# Patient Record
Sex: Female | Born: 1970 | Race: White | Hispanic: No | Marital: Married | Smoking: Light tobacco smoker
Health system: Southern US, Community
[De-identification: ages and names within clinical notes are randomized; demographics above are authoritative.]

## PROBLEM LIST (undated history)

## (undated) DIAGNOSIS — E785 Hyperlipidemia, unspecified: Secondary | ICD-10-CM

## (undated) DIAGNOSIS — R011 Cardiac murmur, unspecified: Secondary | ICD-10-CM

## (undated) DIAGNOSIS — J45909 Unspecified asthma, uncomplicated: Secondary | ICD-10-CM

## (undated) DIAGNOSIS — N200 Calculus of kidney: Secondary | ICD-10-CM

## (undated) DIAGNOSIS — T7840XA Allergy, unspecified, initial encounter: Secondary | ICD-10-CM

## (undated) DIAGNOSIS — B019 Varicella without complication: Secondary | ICD-10-CM

## (undated) HISTORY — DX: Calculus of kidney: N20.0

## (undated) HISTORY — PX: OTHER SURGICAL HISTORY: SHX169

## (undated) HISTORY — DX: Unspecified asthma, uncomplicated: J45.909

## (undated) HISTORY — DX: Cardiac murmur, unspecified: R01.1

## (undated) HISTORY — DX: Varicella without complication: B01.9

## (undated) HISTORY — PX: OVARIAN CYST REMOVAL: SHX89

## (undated) HISTORY — DX: Hyperlipidemia, unspecified: E78.5

## (undated) HISTORY — DX: Allergy, unspecified, initial encounter: T78.40XA

---
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
## 2012-09-12 LAB — HM MAMMOGRAPHY: HM Mammogram: NEGATIVE

## 2014-09-10 LAB — HM PAP SMEAR: HM PAP: NORMAL

## 2015-03-11 ENCOUNTER — Encounter: Payer: Self-pay | Admitting: Nurse Practitioner

## 2015-03-11 ENCOUNTER — Ambulatory Visit (INDEPENDENT_AMBULATORY_CARE_PROVIDER_SITE_OTHER): Payer: 59 | Admitting: Nurse Practitioner

## 2015-03-11 VITALS — BP 118/78 | HR 81 | Temp 99.1°F | Resp 14 | Ht 63.75 in | Wt 162.6 lb

## 2015-03-11 DIAGNOSIS — Z789 Other specified health status: Secondary | ICD-10-CM | POA: Diagnosis not present

## 2015-03-11 DIAGNOSIS — Z7189 Other specified counseling: Secondary | ICD-10-CM | POA: Diagnosis not present

## 2015-03-11 DIAGNOSIS — Z7689 Persons encountering health services in other specified circumstances: Secondary | ICD-10-CM

## 2015-03-11 DIAGNOSIS — E785 Hyperlipidemia, unspecified: Secondary | ICD-10-CM | POA: Insufficient documentation

## 2015-03-11 DIAGNOSIS — E079 Disorder of thyroid, unspecified: Secondary | ICD-10-CM | POA: Diagnosis not present

## 2015-03-11 DIAGNOSIS — J45990 Exercise induced bronchospasm: Secondary | ICD-10-CM

## 2015-03-11 DIAGNOSIS — R011 Cardiac murmur, unspecified: Secondary | ICD-10-CM

## 2015-03-11 LAB — COMPREHENSIVE METABOLIC PANEL
ALT: 16 U/L (ref 0–35)
AST: 21 U/L (ref 0–37)
Albumin: 4 g/dL (ref 3.5–5.2)
Alkaline Phosphatase: 68 U/L (ref 39–117)
BUN: 15 mg/dL (ref 6–23)
CALCIUM: 9.4 mg/dL (ref 8.4–10.5)
CHLORIDE: 105 meq/L (ref 96–112)
CO2: 24 meq/L (ref 19–32)
CREATININE: 0.54 mg/dL (ref 0.40–1.20)
GFR: 131.01 mL/min (ref >60.00)
GLUCOSE: 77 mg/dL (ref 70–99)
Potassium: 4.4 mEq/L (ref 3.5–5.1)
Sodium: 139 mEq/L (ref 135–145)
Total Bilirubin: 0.3 mg/dL (ref 0.2–1.2)
Total Protein: 6.8 g/dL (ref 6.0–8.3)

## 2015-03-11 LAB — T4, FREE: FREE T4: 0.79 ng/dL (ref 0.60–1.60)

## 2015-03-11 LAB — TSH: TSH: 0.41 u[IU]/mL (ref 0.35–4.50)

## 2015-03-11 NOTE — Progress Notes (Signed)
Pre visit review using our clinic review tool, if applicable. No additional management support is needed unless otherwise documented below in the visit note. 

## 2015-03-11 NOTE — Assessment & Plan Note (Signed)
Stable on Lipitor. Pt has a healthy lifestyle. Family history of lipid disorders and heart disease.

## 2015-03-11 NOTE — Assessment & Plan Note (Signed)
Discussed acute and chronic issues. Reviewed health maintenance measures, PFSHx, and immunizations. Obtain records from [place].

## 2015-03-11 NOTE — Assessment & Plan Note (Signed)
Innocent murmur that is undetectable at today's visit. Checked out by Cardiology in [place] with no abnormal findings. Will follow as needed.

## 2015-03-11 NOTE — Assessment & Plan Note (Signed)
Pt reports she does not seroconvert to a positive titer despite up to date immunizations. Will obtain records from [place].

## 2015-03-11 NOTE — Progress Notes (Signed)
Patient ID: Emily Burns, female    DOB: 04/20/1971  Age: 44 y.o. MRN: [id number]  CC: Establish Care   HPI Emily Burns presents for establishing care and CC of thyroid disease concern.   1) New pt info:   Immunizations- Unknown, Flu- Declines today  Mammogram- 2 years ago, normal per pt  Pap- 4/16 back in [place], normal per pt   Eye Exam- 09/2014   Dental Exam- UTD  LMP- 02/2015, regular lasts for 3 days   2) Chronic Problems-  Overweight- Having a hard time losing weight    Diet- Eats at home often, cuts down on "white foods"    Exercise- walks 2 miles 3 x a week  Hyperlipidemia- Lipitor 40 mg on board  Allergies- hay fever, using OTC medications  Measles- Titers in past show non-conversion from vaccine to show immunity   Asthma- Albuterol inhaler prior to exercise   Heart Murmur- Innocent, has been checked by Cardiologist, 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   She has past surgical history that includes Tonsillectomy and adenoidectomy (1982); Cesarean section (2011/2012); Ovarian cyst removal; and kidney stone (2010/2011).   Her family history includes Heart disease in her father; Hyperlipidemia in her brother, maternal aunt, maternal grandfather, maternal uncle, and mother; Hypertension in her father, maternal grandmother, and mother; Thyroid disease in her paternal aunt and paternal grandmother.She reports that she quit smoking about 20 years ago. Her smoking use included Cigarettes. She has never used smokeless tobacco. She reports that she drinks alcohol. She reports that she does not  use illicit drugs.  No outpatient prescriptions prior to visit.   No facility-administered medications prior to visit.    ROS Review of Systems  Constitutional: Negative for fever, chills, diaphoresis, activity change, appetite change and fatigue.  HENT: Negative for tinnitus and trouble swallowing.   Eyes: Negative for visual disturbance.  Respiratory: Negative for chest tightness, shortness of breath and wheezing.   Cardiovascular: Negative for chest pain, palpitations and leg swelling.  Gastrointestinal: Negative for nausea, vomiting, diarrhea and constipation.  Endocrine: Negative for cold intolerance and heat intolerance.  Skin: Negative for rash.  Neurological: Negative for dizziness and headaches.  Psychiatric/Behavioral: Negative for suicidal ideas and sleep disturbance. The patient is nervous/anxious.     Objective:  BP 118/78 mmHg  Pulse 81  Temp(Src) 99.1 F (37.3 C)  Resp 14  Ht 5' 3.75" (1.619 m)  Wt 162 lb 9.6 oz (73.755 kg)  BMI 28.14 kg/m2  SpO2 97%  Physical Exam  Constitutional: She is oriented to person, place, and time. She appears well-developed and well-nourished. No distress.  HENT:  Head: Normocephalic and atraumatic.  Right Ear: External ear normal.  Left Ear: External ear normal.  Cardiovascular: Normal rate, regular rhythm and normal heart sounds.  Exam reveals no gallop and no friction rub.   No murmur heard. Pulmonary/Chest: Effort normal and breath sounds normal. No respiratory distress. She has no wheezes. She has no rales. She exhibits no tenderness.  Neurological: She is alert and oriented to person, place, and time. No cranial nerve deficit. She exhibits normal muscle tone. Coordination normal.  Skin:  Skin is warm and dry. No rash noted. She is not diaphoretic.  Psychiatric: She has a normal mood and affect. Her behavior is normal. Judgment and thought content normal.   Assessment & Plan:   Emily Burns was seen today for establish  care.  Diagnoses and all orders for this visit:  Thyroid disease -     Comprehensive metabolic panel -     TSH -     T4, free -     T3  Hyperlipidemia -     Comprehensive metabolic panel  Encounter to establish care   I am having Emily Burns maintain her atorvastatin, Biotin, B-complex with vitamin C, Calcium-Magnesium (CAL-MAG PO), and Multiple Vitamin (MULTI-VITAMIN DAILY PO).  Meds ordered this encounter  Medications  . atorvastatin (LIPITOR) 40 MG tablet    Sig: Take 40 mg by mouth daily.  . Biotin 5000 MCG TABS    Sig: Take by mouth daily.  . B Complex-C (B-COMPLEX WITH VITAMIN C) tablet    Sig: Take 1 tablet by mouth daily.  . Calcium-Magnesium (CAL-MAG PO)    Sig: Take by mouth daily.  . Multiple Vitamin (MULTI-VITAMIN DAILY PO)    Sig: Take by mouth daily.     Follow-up: Return in about 6 months (around 09/09/2015) for CPE with fasting labs .

## 2015-03-11 NOTE — Patient Instructions (Signed)
Welcome to [facility]! Nice to meet you.  Please visit the lab before leaving today.   See you back in 6 months (fasting labs and physical)

## 2015-03-11 NOTE — Assessment & Plan Note (Signed)
Asthma is EIB. Pt has albuterol inhaler that she uses when going to exercise. Will follow

## 2015-03-11 NOTE — Assessment & Plan Note (Signed)
Pt concerned about thyroid due to history of biopsy and failing medications in April. Will obtain records and will obtain thyroid panel today.

## 2015-03-12 LAB — T3: T3 TOTAL: 115.64 ng/dL (ref 80.0–204.0)

## 2015-04-11 ENCOUNTER — Ambulatory Visit (INDEPENDENT_AMBULATORY_CARE_PROVIDER_SITE_OTHER): Payer: 59 | Admitting: Nurse Practitioner

## 2015-04-11 ENCOUNTER — Encounter: Payer: Self-pay | Admitting: Nurse Practitioner

## 2015-04-11 VITALS — BP 128/89 | HR 109 | Temp 99.0°F | Ht 63.75 in | Wt 160.1 lb

## 2015-04-11 DIAGNOSIS — M62838 Other muscle spasm: Secondary | ICD-10-CM | POA: Diagnosis not present

## 2015-04-11 MED ORDER — CYCLOBENZAPRINE HCL 5 MG PO TABS: 5.0000 mg | ORAL_TABLET | Freq: Three times a day (TID) | ORAL | Status: AC | PRN

## 2015-04-11 MED ORDER — PREDNISONE 10 MG PO TABS: ORAL_TABLET | ORAL | Status: DC

## 2015-04-11 NOTE — Progress Notes (Signed)
Patient ID: Emily Burns, female    DOB: 02/07/1971  Age: 44 y.o. MRN: [id number]  CC: Shoulder Pain   HPI Emily Burns presents for CC of neck and rib pain.  1) Started Saturday, sharp pain on left lateral ribs, lasted a few seconds, deep breathing caused pain  Heating pad Motrin  Pain was gone in ribs yesterday   Picked 44 year old up and felt discomfort again afterwards Pressure of shoulder on left and a visible bulging from the shoulder arm Motrin- helpful   History Emily Burns has a past medical history of Asthma; Chicken pox; Allergy; Heart murmur; Hyperlipidemia; and Kidney stone.   Emily Burns has past surgical history that includes Tonsillectomy and adenoidectomy (1982); Cesarean section (2011/2012); Ovarian cyst removal; and kidney stone (2010/2011).   Her family history includes Heart disease in her father; Hyperlipidemia in her brother, maternal aunt, maternal grandfather, maternal uncle, and mother; Hypertension in her father, maternal grandmother, and mother; Thyroid disease in her paternal aunt and paternal grandmother.Emily Burns reports that Emily Burns quit smoking about 20 years ago. Her smoking use included Cigarettes. Emily Burns has never used smokeless tobacco. Emily Burns reports that Emily Burns drinks alcohol. Emily Burns reports that Emily Burns does not use illicit drugs.  Outpatient Prescriptions Prior to Visit  Medication Sig Dispense Refill  . atorvastatin (LIPITOR) 40 MG tablet Take 40 mg by mouth daily.    . B Complex-C (B-COMPLEX WITH VITAMIN C) tablet Take 1 tablet by mouth daily.    . Biotin 5000 MCG TABS Take by mouth daily.    . Calcium-Magnesium (CAL-MAG PO) Take by mouth daily.    . Multiple Vitamin (MULTI-VITAMIN DAILY PO) Take by mouth daily.     No facility-administered medications prior to visit.    ROS Review of Systems  Constitutional: Negative for fever, chills, diaphoresis and fatigue.  Respiratory: Negative for chest tightness, shortness of breath and wheezing.   Cardiovascular: Negative  for chest pain, palpitations and leg swelling.  Gastrointestinal: Negative for nausea, vomiting and diarrhea.  Musculoskeletal: Positive for myalgias and arthralgias.  Skin: Negative for rash.  Neurological: Negative for dizziness, weakness, numbness and headaches.  Psychiatric/Behavioral: The patient is nervous/anxious.     Objective:  BP 128/89 mmHg  Pulse 109  Temp(Src) 99 F (37.2 C)  Ht 5' 3.75" (1.619 m)  Wt 160 lb 1.9 oz (72.63 kg)  BMI 27.71 kg/m2  Physical Exam  Constitutional: Emily Burns is oriented to person, place, and time. Emily Burns appears well-developed and well-nourished. No distress.  HENT:  Head: Normocephalic and atraumatic.  Right Ear: External ear normal.  Left Ear: External ear normal.  Cardiovascular: Normal rate, regular rhythm, normal heart sounds and intact distal pulses.  Exam reveals no gallop and no friction rub.   No murmur heard. Pulmonary/Chest: Effort normal and breath sounds normal. No respiratory distress. Emily Burns has no wheezes. Emily Burns has no rales. Emily Burns exhibits no tenderness.  Musculoskeletal: Normal range of motion. Emily Burns exhibits edema and tenderness.       Arms: Noticeable swelling on left clavicular area vs. Right    Neurological: Emily Burns is alert and oriented to person, place, and time. No cranial nerve deficit. Emily Burns exhibits normal muscle tone. Coordination normal.  Skin: Skin is warm and dry. No rash noted. Emily Burns is not diaphoretic.  Psychiatric: Emily Burns has a normal mood and affect. Her behavior is normal. Judgment and thought content normal.    Assessment & Plan:   Emily Burns was seen today for shoulder pain.  Diagnoses and all orders for this visit:  Muscle  spasm  Other orders -     predniSONE (DELTASONE) 10 MG tablet; Take 6 tablets by mouth on day 1 then decrease by 1 tablet each day until gone. -     cyclobenzaprine (FLEXERIL) 5 MG tablet; Take 1 tablet (5 mg total) by mouth 3 (three) times daily as needed for muscle spasms.   I am having Emily Burns  start on predniSONE and cyclobenzaprine. I am also having her maintain her atorvastatin, Biotin, B-complex with vitamin C, Calcium-Magnesium (CAL-MAG PO), and Multiple Vitamin (MULTI-VITAMIN DAILY PO).  Meds ordered this encounter  Medications  . predniSONE (DELTASONE) 10 MG tablet    Sig: Take 6 tablets by mouth on day 1 then decrease by 1 tablet each day until gone.    Dispense:  21 tablet    Refill:  0    Order Specific Question:  Supervising Provider    Answer:  Duncan DullULLO, TERESA L [[id number]]  . cyclobenzaprine (FLEXERIL) 5 MG tablet    Sig: Take 1 tablet (5 mg total) by mouth 3 (three) times daily as needed for muscle spasms.    Dispense:  30 tablet    Refill:  0    Order Specific Question:  Supervising Provider    Answer:  Sherlene ShamsULLO, TERESA L [[id number]]     Follow-up: Return if symptoms worsen or fail to improve.

## 2015-04-11 NOTE — Patient Instructions (Signed)
Prednisone with breakfast or lunch at the latest.  6 tablets on day 1, 5 tablets on day 2, 4 tablets on day 3, 3 tablets on day 4, 2 tablets day 5, 1 tablet on day 6...done! Take tablets all together not spaced out Don't take with NSAIDs (Ibuprofen, Aleve, Naproxen, Meloxicam ect...)  Flexeril at night- do not drive while taking this.   Send me an update in 1 week-ish on MyChart to see how you are doing.  Muscle Cramps and Spasms Muscle cramps and spasms occur when a muscle or muscles tighten and you have no control over this tightening (involuntary muscle contraction). They are a common problem and can develop in any muscle. The most common place is in the calf muscles of the leg. Both muscle cramps and muscle spasms are involuntary muscle contractions, but they also have differences:   Muscle cramps are sporadic and painful. They may last a few seconds to a quarter of an hour. Muscle cramps are often more forceful and last longer than muscle spasms.  Muscle spasms may or may not be painful. They may also last just a few seconds or much longer. CAUSES  It is uncommon for cramps or spasms to be due to a serious underlying problem. In many cases, the cause of cramps or spasms is unknown. Some common causes are:   Overexertion.   Overuse from repetitive motions (doing the same thing over and over).   Remaining in a certain position for a long period of time.   Improper preparation, form, or technique while performing a sport or activity.   Dehydration.   Injury.   Side effects of some medicines.   Abnormally low levels of the salts and ions in your blood (electrolytes), especially potassium and calcium. This could happen if you are taking water pills (diuretics) or you are pregnant.  Some underlying medical problems can make it more likely to develop cramps or spasms. These include, but are not limited to:   Diabetes.   Parkinson disease.   Hormone disorders, such as  thyroid problems.   Alcohol abuse.   Diseases specific to muscles, joints, and bones.   Blood vessel disease where not enough blood is getting to the muscles.  HOME CARE INSTRUCTIONS   Stay well hydrated. Drink enough water and fluids to keep your urine clear or pale yellow.  It may be helpful to massage, stretch, and relax the affected muscle.  For tight or tense muscles, use a warm towel, heating pad, or hot shower water directed to the affected area.  If you are sore or have pain after a cramp or spasm, applying ice to the affected area may relieve discomfort.  Put ice in a plastic bag.  Place a towel between your skin and the bag.  Leave the ice on for 15-20 minutes, 03-04 times a day.  Medicines used to treat a known cause of cramps or spasms may help reduce their frequency or severity. Only take over-the-counter or prescription medicines as directed by your caregiver. SEEK MEDICAL CARE IF:  Your cramps or spasms get more severe, more frequent, or do not improve over time.  MAKE SURE YOU:   Understand these instructions.  Will watch your condition.  Will get help right away if you are not doing well or get worse.   This information is not intended to replace advice given to you by your health care provider. Make sure you discuss any questions you have with your health care provider.  Document Released: 11/14/2001 Document Revised: 09/19/2012 Document Reviewed: 05/11/2012 Elsevier Interactive Patient Education [employer]

## 2015-04-15 ENCOUNTER — Encounter: Payer: Self-pay | Admitting: Nurse Practitioner

## 2015-04-15 ENCOUNTER — Other Ambulatory Visit: Payer: Self-pay | Admitting: Nurse Practitioner

## 2015-04-15 ENCOUNTER — Ambulatory Visit (INDEPENDENT_AMBULATORY_CARE_PROVIDER_SITE_OTHER): Payer: 59 | Admitting: Nurse Practitioner

## 2015-04-15 ENCOUNTER — Ambulatory Visit
Admission: RE | Admit: 2015-04-15 | Discharge: 2015-04-15 | Disposition: A | Discharge status: Home / Self Care | Payer: 59 | Source: Ambulatory Visit | Attending: Nurse Practitioner | Admitting: Nurse Practitioner

## 2015-04-15 ENCOUNTER — Telehealth: Payer: Self-pay | Admitting: Nurse Practitioner

## 2015-04-15 VITALS — BP 130/82 | HR 100 | Temp 98.5°F | Wt 161.0 lb

## 2015-04-15 DIAGNOSIS — M609 Myositis, unspecified: Secondary | ICD-10-CM

## 2015-04-15 DIAGNOSIS — IMO0001 Reserved for inherently not codable concepts without codable children: Secondary | ICD-10-CM | POA: Insufficient documentation

## 2015-04-15 DIAGNOSIS — M62838 Other muscle spasm: Secondary | ICD-10-CM | POA: Insufficient documentation

## 2015-04-15 DIAGNOSIS — M791 Myalgia: Secondary | ICD-10-CM | POA: Diagnosis not present

## 2015-04-15 DIAGNOSIS — R221 Localized swelling, mass and lump, neck: Secondary | ICD-10-CM | POA: Diagnosis not present

## 2015-04-15 DIAGNOSIS — S46812D Strain of other muscles, fascia and tendons at shoulder and upper arm level, left arm, subsequent encounter: Secondary | ICD-10-CM

## 2015-04-15 LAB — C-REACTIVE PROTEIN: CRP: 0.1 mg/dL — ABNORMAL LOW (ref 0.5–20.0)

## 2015-04-15 LAB — CBC WITH DIFFERENTIAL/PLATELET
BASOS ABS: 0.1 10*3/uL (ref 0.0–0.1)
Basophils Relative: 0.7 % (ref 0.0–3.0)
EOS PCT: 0.7 % (ref 0.0–5.0)
Eosinophils Absolute: 0.1 10*3/uL (ref 0.0–0.7)
HEMATOCRIT: 38.8 % (ref 36.0–46.0)
Hemoglobin: 13 g/dL (ref 12.0–15.0)
LYMPHS PCT: 39.2 % (ref 12.0–46.0)
Lymphs Abs: 4.3 10*3/uL — ABNORMAL HIGH (ref 0.7–4.0)
MCHC: 33.4 g/dL (ref 30.0–36.0)
MCV: 86.8 fl (ref 78.0–100.0)
MONOS PCT: 8.1 % (ref 3.0–12.0)
Monocytes Absolute: 0.9 10*3/uL (ref 0.1–1.0)
NEUTROS ABS: 5.6 10*3/uL (ref 1.4–7.7)
Neutrophils Relative %: 51.3 % (ref 43.0–77.0)
PLATELETS: 357 10*3/uL (ref 150.0–400.0)
RBC: 4.47 Mil/uL (ref 3.87–5.11)
RDW: 13.8 % (ref 11.5–15.5)
WBC: 10.9 10*3/uL — ABNORMAL HIGH (ref 4.0–10.5)

## 2015-04-15 LAB — SEDIMENTATION RATE: Sed Rate: 14 mm/hr (ref 0–22)

## 2015-04-15 LAB — TROPONIN I: TNIDX: 0.01 ug/L (ref 0.00–0.06)

## 2015-04-15 MED ORDER — MELOXICAM 15 MG PO TABS: 15.0000 mg | ORAL_TABLET | Freq: Every day | ORAL | Status: DC

## 2015-04-15 NOTE — Assessment & Plan Note (Signed)
Will obtain US of head and neck. Will obtain Troponin, ESR, CRP, and CBC w/ diff.

## 2015-04-15 NOTE — Progress Notes (Signed)
Patient ID: Emily Burns, female    DOB: 09/04/1970  Age: 44 y.o. MRN: [id number]  CC: Follow-up   HPI Malon Hollar presents for follow up of lump at neck.   1) Lump is larger at neck and causing pain. Prednisone made her feel loopy. She reports constant headaches and pressure from swelling. She is very anxious about this.   History Skyeler has a past medical history of Asthma; Chicken pox; Allergy; Heart murmur; Hyperlipidemia; and Kidney stone.   She has past surgical history that includes Tonsillectomy and adenoidectomy (1982); Cesarean section (2011/2012); Ovarian cyst removal; and kidney stone (2010/2011).   Her family history includes Heart disease in her father; Hyperlipidemia in her brother, maternal aunt, maternal grandfather, maternal uncle, and mother; Hypertension in her father, maternal grandmother, and mother; Thyroid disease in her paternal aunt and paternal grandmother.She reports that she quit smoking about 20 years ago. Her smoking use included Cigarettes. She has never used smokeless tobacco. She reports that she drinks alcohol. She reports that she does not use illicit drugs.  Outpatient Prescriptions Prior to Visit  Medication Sig Dispense Refill  . atorvastatin (LIPITOR) 40 MG tablet Take 40 mg by mouth daily.    . B Complex-C (B-COMPLEX WITH VITAMIN C) tablet Take 1 tablet by mouth daily.    . Biotin 5000 MCG TABS Take by mouth daily.    . Calcium-Magnesium (CAL-MAG PO) Take by mouth daily.    . cyclobenzaprine (FLEXERIL) 5 MG tablet Take 1 tablet (5 mg total) by mouth 3 (three) times daily as needed for muscle spasms. 30 tablet 0  . Multiple Vitamin (MULTI-VITAMIN DAILY PO) Take by mouth daily.    . predniSONE (DELTASONE) 10 MG tablet Take 6 tablets by mouth on day 1 then decrease by 1 tablet each day until gone. 21 tablet 0   No facility-administered medications prior to visit.    ROS Review of Systems  Constitutional: Negative for fever, chills,  diaphoresis and fatigue.  Respiratory: Negative for chest tightness, shortness of breath and wheezing.   Cardiovascular: Negative for chest pain, palpitations and leg swelling.  Gastrointestinal: Negative for nausea, vomiting and diarrhea.  Musculoskeletal: Positive for myalgias and neck pain.  Skin: Negative for rash.  Neurological: Positive for headaches.  Psychiatric/Behavioral: The patient is nervous/anxious.     Objective:  BP 142/90 mmHg  Pulse 100  Temp(Src) 98.5 F (36.9 C) (Oral)  Wt 161 lb (73.029 kg)  SpO2 99%  Physical Exam  Constitutional: She is oriented to person, place, and time. She appears well-developed and well-nourished. No distress.  HENT:  Head: Normocephalic and atraumatic.  Right Ear: External ear normal.  Left Ear: External ear normal.  Musculoskeletal: Normal range of motion. She exhibits edema. She exhibits no tenderness.       Arms: Increased swelling from last time  Neurological: She is alert and oriented to person, place, and time. No cranial nerve deficit. She exhibits normal muscle tone. Coordination normal.  Skin: Skin is warm and dry. No rash noted. She is not diaphoretic.  Psychiatric: She has a normal mood and affect. Her behavior is normal. Judgment and thought content normal.   Assessment & Plan:   Cleda was seen today for follow-up.  Diagnoses and all orders for this visit:  Mass of lateral neck -     US Soft Tissue Head/Neck; Future -     CBC with Differential/Platelet -     Sed Rate (ESR) -     C-reactive protein -       Troponin I  Myalgia and myositis -     US Soft Tissue Head/Neck; Future -     CBC with Differential/Platelet -     Sed Rate (ESR) -     C-reactive protein -     Troponin I   I am having Ms. Klipfel maintain her atorvastatin, Biotin, B-complex with vitamin C, Calcium-Magnesium (CAL-MAG PO), Multiple Vitamin (MULTI-VITAMIN DAILY PO), predniSONE, and cyclobenzaprine.  No orders of the defined types were  placed in this encounter.     Follow-up: Return if symptoms worsen or fail to improve. 

## 2015-04-15 NOTE — Telephone Encounter (Signed)
Called husband and asked him to have her call me back for results.

## 2015-04-15 NOTE — Assessment & Plan Note (Addendum)
MSK pain of left side. Will try prednisone taper and flexeril. RTC if no improvement.

## 2015-04-15 NOTE — Patient Instructions (Signed)
Please visit the lab today. We will get you set up for your ultrasound.

## 2015-04-16 ENCOUNTER — Encounter: Payer: Self-pay | Admitting: Nurse Practitioner

## 2015-04-16 ENCOUNTER — Other Ambulatory Visit: Payer: Self-pay | Admitting: Nurse Practitioner

## 2015-04-16 DIAGNOSIS — S46812S Strain of other muscles, fascia and tendons at shoulder and upper arm level, left arm, sequela: Secondary | ICD-10-CM

## 2015-04-18 ENCOUNTER — Other Ambulatory Visit: Payer: Self-pay | Admitting: Nurse Practitioner

## 2015-04-18 MED ORDER — TRAMADOL HCL 50 MG PO TABS: 50.0000 mg | ORAL_TABLET | Freq: Three times a day (TID) | ORAL | Status: DC | PRN

## 2015-04-25 ENCOUNTER — Ambulatory Visit (INDEPENDENT_AMBULATORY_CARE_PROVIDER_SITE_OTHER): Payer: 59 | Admitting: Family Medicine

## 2015-04-25 ENCOUNTER — Encounter: Payer: Self-pay | Admitting: Family Medicine

## 2015-04-25 VITALS — BP 132/88 | HR 97 | Ht 63.75 in | Wt 161.0 lb

## 2015-04-25 DIAGNOSIS — M9901 Segmental and somatic dysfunction of cervical region: Secondary | ICD-10-CM

## 2015-04-25 DIAGNOSIS — M9908 Segmental and somatic dysfunction of rib cage: Secondary | ICD-10-CM

## 2015-04-25 DIAGNOSIS — M9902 Segmental and somatic dysfunction of thoracic region: Secondary | ICD-10-CM

## 2015-04-25 DIAGNOSIS — M940 Chondrocostal junction syndrome [Tietze]: Secondary | ICD-10-CM | POA: Diagnosis not present

## 2015-04-25 DIAGNOSIS — R1013 Epigastric pain: Secondary | ICD-10-CM | POA: Diagnosis not present

## 2015-04-25 DIAGNOSIS — M999 Biomechanical lesion, unspecified: Secondary | ICD-10-CM | POA: Insufficient documentation

## 2015-04-25 NOTE — Patient Instructions (Signed)
Good to see you Ice is your friend pennsaid pinkie amount topically 2 times daily as needed.  Turmeric 500mg  twice daily Vitamin D 2000 IU daily Sleep with elbow beneath shoulder.  Take nexium daily for next week and see if it helps See me again in 3 weeks if needed

## 2015-04-25 NOTE — Assessment & Plan Note (Signed)
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 Patient did have an elevated rib. We discussed lifting mechanics, sleeping position, and patient did respond well to osteopathic manipulation. I think some of this could be stress induced as well. Patient will do some more postural training. We discussed icing. Patient will try topical anti-inflammatories. Patient will come back and see me again in 3 weeks for further evaluation and treatment.

## 2015-04-25 NOTE — Progress Notes (Signed)
Tawana ScaleZach Bowden Boody D.O. [facility] [address] [place], [place] [address] Phone: [phone] Subjective:    I'm seeing this patient by the request  of:  Doss, Oleh Geninarrie M, NP   CC: Left trapezius spasm and left flank pain  WGN:[id number]:Subjective Tyshawn Chipps is a 44 y.o. female coming in with complaint of left trapezius spasm. Patient was seen by primary care provider and there was a concern for possible mass on her left trapezius and her to the size of a muscle spasm. Ultrasound was ordered. Did not show any mass. Continued have pain. Was given muscle relaxers with minimal benefit. Continue taking anti-inflammatories which I had been helpful. Denies any fevers, chills, abnormal weight loss. Denies any radiation down the arm. States that the pain seems to be improving slowly. Denies any recent lower joint swelling states that she can do daily activities but does give her discomfort at night.  Patient is also complaining of left flank pain. Patient states that this seems to be more intermittent. No rash noted. Once again no other 6 significant systemic findings. Patient does do yoga and has noticed significant more tightness on the side compared to the other side. Entrance to start running recently but has some difficulty. Denies numbness or tingling and denies any recent illnesses. Has not notice it any association with food but has not been looking closely.     Past Medical History  Diagnosis Date  . Asthma   . Chicken pox   . Allergy     Seasonal  . Heart murmur   . Hyperlipidemia   . Kidney stone    Past Surgical History  Procedure Laterality Date  . Tonsillectomy and adenoidectomy  1982  . Cesarean section  2011/2012  . Ovarian cyst removal    . Kidney stone  2010/2011   ' Social History  Substance Use Topics  . Smoking status: Former Smoker    Types: Cigarettes    Quit date: 03/11/1995  . Smokeless tobacco: Never Used  . Alcohol Use: 0.0 oz/week    0 Standard drinks  or equivalent per week     Comment: Rare    No Known Allergies Family History  Problem Relation Age of Onset  . Hyperlipidemia Mother   . Hypertension Mother   . Heart disease Father   . Hypertension Father   . Hyperlipidemia Brother   . Hyperlipidemia Maternal Aunt   . Hyperlipidemia Maternal Uncle   . Hypertension Maternal Grandmother   . Hyperlipidemia Maternal Grandfather   . Thyroid disease Paternal Aunt     Removed  . Thyroid disease Paternal Grandmother     Removed     Past medical history, social, surgical and family history all reviewed in electronic medical record.   Review of Systems: No headache, visual changes, nausea, vomiting, diarrhea, constipation, dizziness, abdominal pain, skin rash, fevers, chills, night sweats, weight loss, swollen lymph nodes, body aches, joint swelling, muscle aches, chest pain, shortness of breath, mood changes.   Objective Blood pressure 132/88, pulse 97, height 5' 3.75" (1.619 m), weight 161 lb (73.029 kg), SpO2 97 %.  General: No apparent distress alert and oriented x3 mood and affect normal, dressed appropriately.  HEENT: Pupils equal, extraocular movements intact enlarged thyroid Respiratory: Patient's speak in full sentences and does not appear short of breath  Cardiovascular: No lower extremity edema, non tender, no erythema  Skin: Warm dry intact with no signs of infection or rash on extremities or on axial skeleton.  Abdomen: Soft  tenderness in the epigastric region no masses noted, no tenderness in the left quadrant. No rash noted over the skin in the area. Neuro: Cranial nerves II through XII are intact, neurovascularly intact in all extremities with 2+ DTRs and 2+ pulses.  Lymph: No lymphadenopathy of posterior or anterior cervical chain or axillae bilaterally.  Gait normal with good balance and coordination.  MSK:  Non tender with full range of motion and good stability and symmetric strength and tone of , elbows, wrist, hip,  knee and ankles bilaterally.  Neck: Inspection unremarkable. No palpable stepoffs. Negative Spurling's maneuver. Full neck range of motion Grip strength and sensation normal in bilateral hands Strength good C4 to T1 distribution No sensory change to C4 to T1 Negative Hoffman sign bilaterally Reflexes normal Shoulder: Left She was found to have a potential spasm of the left trapezius. Elevated first rib Palpation is normal with no tenderness over AC joint or bicipital groove. ROM is full in all planes. Rotator cuff strength normal throughout. No signs of impingement with negative Neer and Hawkin's tests, empty can sign. Speeds and Yergason's tests normal. No labral pathology noted with negative Obrien's, negative clunk and good stability. Normal scapular function observed. No painful arc and no drop arm sign. No apprehension sign   Osteopathic findings C2 flexed rotated and side bent right T1 extended rotated as up and left with elevated first rib T3 extended rotated and side bent right    Impression and Recommendations:     This case required medical decision making of moderate complexity.

## 2015-04-25 NOTE — Assessment & Plan Note (Signed)
Decision today to treat with OMT was based on Physical Exam  After verbal consent patient was treated with HVLA, ME, FPR techniques in cervical, thoracic, rib areas  Patient tolerated the procedure well with improvement in symptoms  Patient given exercises, stretches and lifestyle modifications  See medications in patient instructions if given  Patient will follow up in 3-4 weeks 

## 2015-04-25 NOTE — Assessment & Plan Note (Signed)
I believe the patient is having more of an abdominal pain. Likely attending patient is having some small ulcer in the epigastric region that seems to be radiating to the left flank area. No masses palpated. Patient given a week trial of a proton pump inhibitor. We will see if this makes some benefit. There is also within the differential is a possibility of tight hip flexors causing some radicular symptoms. Patient did respond fairly well to osteopathic manipulation. We discussed icing regimen. Patient will come back and see me again in 3 weeks for further evaluation and treatment.

## 2015-04-25 NOTE — Progress Notes (Signed)
Pre visit review using our clinic review tool, if applicable. No additional management support is needed unless otherwise documented below in the visit note. 

## 2015-04-30 ENCOUNTER — Ambulatory Visit: Payer: 59 | Admitting: Family Medicine

## 2015-05-13 ENCOUNTER — Encounter: Payer: Self-pay | Admitting: Emergency Medicine

## 2015-05-13 ENCOUNTER — Emergency Department: Payer: 59

## 2015-05-13 ENCOUNTER — Emergency Department
Admission: EM | Admit: 2015-05-13 | Discharge: 2015-05-13 | Disposition: A | Discharge status: Home / Self Care | Payer: 59 | Attending: Student | Admitting: Student

## 2015-05-13 DIAGNOSIS — R0789 Other chest pain: Secondary | ICD-10-CM | POA: Insufficient documentation

## 2015-05-13 DIAGNOSIS — M7981 Nontraumatic hematoma of soft tissue: Secondary | ICD-10-CM | POA: Diagnosis not present

## 2015-05-13 DIAGNOSIS — Z3202 Encounter for pregnancy test, result negative: Secondary | ICD-10-CM | POA: Insufficient documentation

## 2015-05-13 DIAGNOSIS — F17210 Nicotine dependence, cigarettes, uncomplicated: Secondary | ICD-10-CM | POA: Insufficient documentation

## 2015-05-13 DIAGNOSIS — Z79899 Other long term (current) drug therapy: Secondary | ICD-10-CM | POA: Diagnosis not present

## 2015-05-13 DIAGNOSIS — M542 Cervicalgia: Secondary | ICD-10-CM | POA: Diagnosis not present

## 2015-05-13 LAB — CBC WITH DIFFERENTIAL/PLATELET
BASOS ABS: 0.1 10*3/uL (ref 0–0.1)
BASOS PCT: 1 %
EOS ABS: 0.3 10*3/uL (ref 0–0.7)
EOS PCT: 3 %
HCT: 38.9 % (ref 35.0–47.0)
Hemoglobin: 13.4 g/dL (ref 12.0–16.0)
LYMPHS PCT: 37 %
Lymphs Abs: 3.1 10*3/uL (ref 1.0–3.6)
MCH: 29.8 pg (ref 26.0–34.0)
MCHC: 34.4 g/dL (ref 32.0–36.0)
MCV: 86.6 fL (ref 80.0–100.0)
MONO ABS: 0.7 10*3/uL (ref 0.2–0.9)
Monocytes Relative: 8 %
Neutro Abs: 4.3 10*3/uL (ref 1.4–6.5)
Neutrophils Relative %: 51 %
Platelets: 320 10*3/uL (ref 150–440)
RBC: 4.49 MIL/uL (ref 3.80–5.20)
RDW: 14.5 % (ref 11.5–14.5)
WBC: 8.4 10*3/uL (ref 3.6–11.0)

## 2015-05-13 LAB — COMPREHENSIVE METABOLIC PANEL
ALBUMIN: 4.3 g/dL (ref 3.5–5.0)
ALT: 21 U/L (ref 14–54)
AST: 23 U/L (ref 15–41)
Alkaline Phosphatase: 79 U/L (ref 38–126)
Anion gap: 5 (ref 5–15)
BUN: 18 mg/dL (ref 6–20)
CHLORIDE: 106 mmol/L (ref 101–111)
CO2: 30 mmol/L (ref 22–32)
CREATININE: 0.59 mg/dL (ref 0.44–1.00)
Calcium: 9.5 mg/dL (ref 8.9–10.3)
GFR calc Af Amer: >60 mL/min (ref >60)
GFR calc non Af Amer: >60 mL/min (ref >60)
Glucose, Bld: 97 mg/dL (ref 65–99)
POTASSIUM: 3.9 mmol/L (ref 3.5–5.1)
SODIUM: 141 mmol/L (ref 135–145)
Total Bilirubin: <0.1 mg/dL — ABNORMAL LOW (ref 0.3–1.2)
Total Protein: 7.3 g/dL (ref 6.5–8.1)

## 2015-05-13 LAB — POCT PREGNANCY, URINE: PREG TEST UR: NEGATIVE

## 2015-05-13 MED ORDER — TIZANIDINE HCL 4 MG PO TABS: 4.0000 mg | ORAL_TABLET | Freq: Three times a day (TID) | ORAL | Status: DC

## 2015-05-13 MED ORDER — KETOROLAC TROMETHAMINE 30 MG/ML IJ SOLN
60.0000 mg | Freq: Once | INTRAMUSCULAR | Status: AC
  Administered 2015-05-13: 60 mg via INTRAVENOUS
  Filled 2015-05-13: qty 2

## 2015-05-13 MED ORDER — KETOROLAC TROMETHAMINE 10 MG PO TABS: 10.0000 mg | ORAL_TABLET | Freq: Four times a day (QID) | ORAL | Status: DC | PRN

## 2015-05-13 MED ORDER — KETOROLAC TROMETHAMINE 60 MG/2ML IM SOLN: 60.0000 mg | Freq: Once | INTRAMUSCULAR | Status: DC

## 2015-05-13 MED ORDER — IOHEXOL 300 MG/ML  SOLN
75.0000 mL | Freq: Once | INTRAMUSCULAR | Status: AC | PRN
  Administered 2015-05-13: 75 mL via INTRAVENOUS
  Filled 2015-05-13: qty 75

## 2015-05-13 NOTE — ED Provider Notes (Signed)
[facility] Emergency Department Provider Note ____________________________________________  Time seen: Approximately 7:41 PM  I have reviewed the triage vital signs and the nursing notes.   HISTORY  Chief Complaint Chest Pain   HPI Emily Burns is a 45 y.o. female who presents to the emergency department for evaluation of left lower rib pain for the past week and a half. She states thatwhen it started she saw her primary care provider who put her on tramadol. The tramadol has not relieved the pain. She also states that she went to see Dr. Tamala Julian who told her that she had a "dislocated rib." She states that he "put it back in place" but feels that it has slipped in and out a few more times since she saw him. She denies injury prior to the onset of pain. She also states that when the pain started in the left lower rib, she developed an enlargement of the left side of her chest wall up to and including the left neck area. She states that there is tenderness over the clavicle as well as the left lower rib. Sitting erect helps relieve some of the pain in the lower rib, but there is "always pressure in the upper part."   Past Medical History  Diagnosis Date  . Asthma   . Chicken pox   . Allergy     Seasonal  . Heart murmur   . Hyperlipidemia   . Kidney stone     Patient Active Problem List   Diagnosis Date Noted  . 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 Abdominal pain, epigastric 04/25/2015  . Nonallopathic lesion-rib cage 04/25/2015  . Muscle spasm 04/15/2015  . Myalgia and myositis 04/15/2015  . Thyroid disease 03/11/2015  . Hyperlipidemia 03/11/2015  . Encounter to establish care 03/11/2015  . Measles, mumps, rubella (MMR) vaccination up to date 03/11/2015  . Asthma, exercise induced 03/11/2015  . Heart murmur 03/11/2015    Past Surgical History  Procedure Laterality Date  . Tonsillectomy and adenoidectomy  1982  . Cesarean section  2011/2012  .  Ovarian cyst removal    . Kidney stone  2010/2011    Current Outpatient Rx  Name  Route  Sig  Dispense  Refill  . atorvastatin (LIPITOR) 40 MG tablet   Oral   Take 40 mg by mouth daily.         . B Complex-C (B-COMPLEX WITH VITAMIN C) tablet   Oral   Take 1 tablet by mouth daily.         . Biotin 5000 MCG TABS   Oral   Take by mouth daily.         . Calcium-Magnesium (CAL-MAG PO)   Oral   Take by mouth daily.         . cyclobenzaprine (FLEXERIL) 5 MG tablet   Oral   Take 1 tablet (5 mg total) by mouth 3 (three) times daily as needed for muscle spasms.   30 tablet   0   . ketorolac (TORADOL) 10 MG tablet   Oral   Take 1 tablet (10 mg total) by mouth every 6 (six) hours as needed.   20 tablet   0   . Multiple Vitamin (MULTI-VITAMIN DAILY PO)   Oral   Take by mouth daily.         Marland Kitchen tiZANidine (ZANAFLEX) 4 MG tablet   Oral   Take 1 tablet (4 mg total) by mouth 3 (three) times daily.   30 tablet  0   . traMADol (ULTRAM) 50 MG tablet   Oral   Take 1 tablet (50 mg total) by mouth every 8 (eight) hours as needed.   30 tablet   0     Allergies Review of patient's allergies indicates no known allergies.  Family History  Problem Relation Age of Onset  . Hyperlipidemia Mother   . Hypertension Mother   . Heart disease Father   . Hypertension Father   . Hyperlipidemia Brother   . Hyperlipidemia Maternal Aunt   . Hyperlipidemia Maternal Uncle   . Hypertension Maternal Grandmother   . Hyperlipidemia Maternal Grandfather   . Thyroid disease Paternal Aunt     Removed  . Thyroid disease Paternal Grandmother     Removed    Social History Social History  Substance Use Topics  . Smoking status: Light Tobacco Smoker    Types: Cigarettes    Last Attempt to Quit: 03/11/1995  . Smokeless tobacco: Never Used  . Alcohol Use: 0.0 oz/week    0 Standard drinks or equivalent per week     Comment: Rare     Review of Systems Constitutional: No recent  illness. Eyes: No visual changes. ENT: No sore throat. Cardiovascular: Denies chest pain or palpitations. Respiratory: Denies shortness of breath. Gastrointestinal: No abdominal pain.  Genitourinary: Negative for dysuria. Musculoskeletal: Pain in left chest wall Skin: Negative for rash. Neurological: Negative for headaches, focal weakness or numbness. 10-point ROS otherwise negative.  ____________________________________________   PHYSICAL EXAM:  VITAL SIGNS: ED Triage Vitals  Enc Vitals Group     BP 05/13/15 1814 128/82 mmHg     Pulse Rate 05/13/15 1814 85     Resp 05/13/15 1814 18     Temp 05/13/15 1814 98.5 F (36.9 C)     Temp Source 05/13/15 1814 Oral     SpO2 05/13/15 1814 96 %     Weight 05/13/15 1814 156 lb (70.761 kg)     Height 05/13/15 1814 5' 3.75" (1.619 m)     Head Cir --      Peak Flow --      Pain Score 05/13/15 1829 7     Pain Loc --      Pain Edu? --      Excl. in [place]? --     Constitutional: Alert and oriented. Well appearing and in no acute distress. Eyes: Conjunctivae are normal. EOMI. Head: Atraumatic. Nose: No congestion/rhinnorhea. Neck: No stridor.  Respiratory: Normal respiratory effort.   Musculoskeletal: Chest appears asymmetric from the superior left clavicle area to the breast. There appears to be swelling and some ecchymosis at the axillary skin fold on the left side as well. Neurologic:  Normal speech and language. No gross focal neurologic deficits are appreciated. Speech is normal. No gait instability. Skin:  Skin is warm, dry and intact. Atraumatic. Psychiatric: Mood and affect are normal. Speech and behavior are normal.  ____________________________________________   LABS (all labs ordered are listed, but only abnormal results are displayed)  Labs Reviewed  COMPREHENSIVE METABOLIC PANEL - Abnormal; Notable for the following:    Total Bilirubin <0.1 (*)    All other components within normal limits  CBC WITH  DIFFERENTIAL/PLATELET  POC URINE PREG, ED  POCT PREGNANCY, URINE   ____________________________________________  RADIOLOGY  Rib and chest x-ray negative for acute abnormality per radiology. I, Sherrie George, personally viewed and evaluated these images (plain radiographs) as part of my medical decision making.   7 mm thyroid nodule, otherwise normal CT  scan of the chest with contrast per radiology.  ____________________________________________   PROCEDURES  Procedure(s) performed: None   ____________________________________________   INITIAL IMPRESSION / ASSESSMENT AND PLAN / ED COURSE  Pertinent labs & imaging results that were available during my care of the patient were reviewed by me and considered in my medical decision making (see chart for details).  The patient was advised to follow-up with her primary care provider or establish a new provider if she is dissatisfied with the current. She was advised that she will need to have a thyroid ultrasound. She reports that she has a history of a thyroid biopsy that was negative. She was advised to stop the Flexeril. She was advised to take the meloxicam and Zanaflex as prescribed. She was advised to return to the emergency department for symptoms that change or worsen if she is unable schedule an appointment. ____________________________________________   FINAL CLINICAL IMPRESSION(S) / ED DIAGNOSES  Final diagnoses:  Chest wall pain       Victorino Dike, FNP 05/13/15 [place] Gayle, MD 05/14/15 [phone]

## 2015-05-13 NOTE — ED Notes (Signed)
Assess per PA 

## 2015-05-13 NOTE — ED Notes (Signed)
Patient transported to CT 

## 2015-05-13 NOTE — ED Notes (Signed)
Patient presents to the ED with lower left rib pain x 1.5 weeks.  Patient states she dislocated a rib in October and since then she has dislocated it twice before.  Patient reports pain x 1.5 weeks.  Patient states she has felt more pain with breathing this time than before.  Patient denies any trauma that she noticed.

## 2015-05-13 NOTE — Discharge Instructions (Signed)

## 2015-05-15 ENCOUNTER — Ambulatory Visit (INDEPENDENT_AMBULATORY_CARE_PROVIDER_SITE_OTHER)
Admission: RE | Admit: 2015-05-15 | Discharge: 2015-05-15 | Disposition: A | Discharge status: Home / Self Care | Payer: 59 | Source: Ambulatory Visit | Attending: Family Medicine | Admitting: Family Medicine

## 2015-05-15 ENCOUNTER — Other Ambulatory Visit (INDEPENDENT_AMBULATORY_CARE_PROVIDER_SITE_OTHER): Payer: 59

## 2015-05-15 ENCOUNTER — Telehealth: Payer: Self-pay | Admitting: Family Medicine

## 2015-05-15 ENCOUNTER — Ambulatory Visit (INDEPENDENT_AMBULATORY_CARE_PROVIDER_SITE_OTHER): Payer: 59 | Admitting: Family Medicine

## 2015-05-15 ENCOUNTER — Encounter: Payer: Self-pay | Admitting: Family Medicine

## 2015-05-15 VITALS — BP 124/82 | HR 80 | Wt 163.0 lb

## 2015-05-15 DIAGNOSIS — M791 Myalgia: Secondary | ICD-10-CM | POA: Diagnosis not present

## 2015-05-15 DIAGNOSIS — IMO0001 Reserved for inherently not codable concepts without codable children: Secondary | ICD-10-CM

## 2015-05-15 DIAGNOSIS — M5010 Cervical disc disorder with radiculopathy, unspecified cervical region: Secondary | ICD-10-CM

## 2015-05-15 DIAGNOSIS — M79603 Pain in arm, unspecified: Secondary | ICD-10-CM | POA: Diagnosis not present

## 2015-05-15 DIAGNOSIS — M609 Myositis, unspecified: Secondary | ICD-10-CM

## 2015-05-15 LAB — C-REACTIVE PROTEIN: CRP: 0.1 mg/dL — ABNORMAL LOW (ref 0.5–20.0)

## 2015-05-15 LAB — CBC WITH DIFFERENTIAL/PLATELET
BASOS PCT: 0.7 % (ref 0.0–3.0)
Basophils Absolute: 0.1 10*3/uL (ref 0.0–0.1)
Eosinophils Absolute: 0.2 10*3/uL (ref 0.0–0.7)
Eosinophils Relative: 2.3 % (ref 0.0–5.0)
HEMATOCRIT: 39.6 % (ref 36.0–46.0)
Hemoglobin: 13.2 g/dL (ref 12.0–15.0)
LYMPHS PCT: 24.7 % (ref 12.0–46.0)
Lymphs Abs: 1.8 10*3/uL (ref 0.7–4.0)
MCHC: 33.3 g/dL (ref 30.0–36.0)
MCV: 86.4 fl (ref 78.0–100.0)
MONOS PCT: 7.1 % (ref 3.0–12.0)
Monocytes Absolute: 0.5 10*3/uL (ref 0.1–1.0)
NEUTROS ABS: 4.8 10*3/uL (ref 1.4–7.7)
Neutrophils Relative %: 65.2 % (ref 43.0–77.0)
PLATELETS: 332 10*3/uL (ref 150.0–400.0)
RBC: 4.58 Mil/uL (ref 3.87–5.11)
RDW: 14.6 % (ref 11.5–15.5)
WBC: 7.4 10*3/uL (ref 4.0–10.5)

## 2015-05-15 LAB — TSH: TSH: 0.28 u[IU]/mL — ABNORMAL LOW (ref 0.35–4.50)

## 2015-05-15 LAB — SEDIMENTATION RATE: Sed Rate: 10 mm/hr (ref 0–22)

## 2015-05-15 LAB — T4, FREE: Free T4: 0.94 ng/dL (ref 0.60–1.60)
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
## 2015-05-15 NOTE — Telephone Encounter (Signed)
Left message to try the OTC meds to see if they help instead of the wrap

## 2015-05-15 NOTE — Telephone Encounter (Signed)
She was wondering if the pain could be caused by the dislocated rib she had before and if a wrap would help ease the pain.

## 2015-05-15 NOTE — Assessment & Plan Note (Signed)
I do believe the patient is going to have more of a cervical radiculopathy. X-rays ordered today to further evaluate for degenerative disc disease. Depending on these findings and patient's response to conservative therapy including muscle relaxers, pain medications and anti-inflammatories we may need to consider advanced imaging. Patient knows if any weakness or numbness occurs of the upper extremity to seek medical attention immediately. Patient otherwise will come back again in 2 weeks if workup was unremarkable with start osteopathic manipulation again.

## 2015-05-15 NOTE — Assessment & Plan Note (Signed)
Discussed with patient again at length. I do believe the patient is having more of a myalgia. Could be secondary to her thyroid disease. Patient though has painless seem to be out of proportion. I do feel that further workup with laboratory tests could be beneficial. Patient is going to trying to seen there is any autoimmune history in her family. At this moment she does not remember any. Patient continues to have pain and workup has been unremarkable. Allows her to continue to do daily activities but can be very uncomfortable.  Spent  25 minutes with patient face-to-face and had greater than 50% of counseling including as described above in assessment and plan.

## 2015-05-15 NOTE — Telephone Encounter (Signed)
Wrap would likely not help.  I would try salon pas or capsicin cream OTC.

## 2015-05-15 NOTE — Patient Instructions (Signed)
Good to see you We will get Xray of your neck We will get labs to make sure nothing is going on such as auto immune disease.  Duexis 3 times daily for 6 days.  Gabapentin to start at night.  Start with 100mg  at night for 5 nights then 200mg  nightly thereafter.  Depending on findings we will discuss need for further imaging of either the neck or the thyroid.  See me again in 2 weeks otherwise and we may start manipulation again.  Happy holidays!

## 2015-05-16 ENCOUNTER — Telehealth: Payer: Self-pay | Admitting: Family Medicine

## 2015-05-16 LAB — CYCLIC CITRUL PEPTIDE ANTIBODY, IGG

## 2015-05-16 LAB — ANTI-NUCLEAR AB-TITER (ANA TITER): ANA Titer 1: 1:160 {titer} — ABNORMAL HIGH

## 2015-05-16 LAB — ANA: Anti Nuclear Antibody(ANA): POSITIVE — AB

## 2015-05-16 LAB — ANTI-DNA ANTIBODY, DOUBLE-STRANDED: ds DNA Ab: 6 IU/mL — ABNORMAL HIGH

## 2015-05-16 NOTE — Telephone Encounter (Signed)
Pt left me a msg on mychart appt request "I got my results from the xray and according to what I have read there is nothing showing other than mild arthritis. Nothing that would cause the swelling or the pain. Does this mean there is no pinched nerve? How long do I wait before scheduling the MRI? Emily Burns " Can you please give her a call?

## 2015-05-16 NOTE — Telephone Encounter (Signed)
Did my chart message.

## 2015-05-17 ENCOUNTER — Other Ambulatory Visit: Payer: Self-pay

## 2015-05-17 DIAGNOSIS — IMO0001 Reserved for inherently not codable concepts without codable children: Secondary | ICD-10-CM

## 2015-05-21 ENCOUNTER — Ambulatory Visit: Payer: 59 | Admitting: Family Medicine

## 2015-05-28 ENCOUNTER — Ambulatory Visit: Payer: 59 | Admitting: Family Medicine

## 2015-05-31 ENCOUNTER — Other Ambulatory Visit: Payer: Self-pay | Admitting: Nurse Practitioner

## 2015-05-31 ENCOUNTER — Encounter: Payer: Self-pay | Admitting: Nurse Practitioner

## 2015-05-31 ENCOUNTER — Encounter: Payer: Self-pay | Admitting: Family Medicine

## 2015-05-31 DIAGNOSIS — E010 Iodine-deficiency related diffuse (endemic) goiter: Secondary | ICD-10-CM

## 2015-06-04 ENCOUNTER — Encounter: Payer: Self-pay | Admitting: Nurse Practitioner

## 2015-06-04 ENCOUNTER — Ambulatory Visit
Admission: RE | Admit: 2015-06-04 | Discharge: 2015-06-04 | Disposition: A | Discharge status: Home / Self Care | Payer: 59 | Source: Ambulatory Visit | Attending: Nurse Practitioner | Admitting: Nurse Practitioner

## 2015-06-04 DIAGNOSIS — E010 Iodine-deficiency related diffuse (endemic) goiter: Secondary | ICD-10-CM

## 2015-06-04 DIAGNOSIS — E042 Nontoxic multinodular goiter: Secondary | ICD-10-CM | POA: Insufficient documentation

## 2015-06-05 ENCOUNTER — Other Ambulatory Visit: Payer: Self-pay | Admitting: Nurse Practitioner

## 2015-06-05 ENCOUNTER — Encounter: Payer: Self-pay | Admitting: Nurse Practitioner

## 2015-06-05 DIAGNOSIS — E041 Nontoxic single thyroid nodule: Secondary | ICD-10-CM

## 2015-06-13 ENCOUNTER — Ambulatory Visit
Admission: RE | Admit: 2015-06-13 | Discharge: 2015-06-13 | Disposition: A | Discharge status: Home / Self Care | Payer: 59 | Source: Ambulatory Visit | Attending: Nurse Practitioner | Admitting: Nurse Practitioner

## 2015-06-13 ENCOUNTER — Other Ambulatory Visit (HOSPITAL_COMMUNITY)
Admission: RE | Admit: 2015-06-13 | Discharge: 2015-06-13 | Disposition: A | Discharge status: Home / Self Care | Payer: 59 | Source: Ambulatory Visit | Attending: Interventional Radiology | Admitting: Interventional Radiology

## 2015-06-13 DIAGNOSIS — E041 Nontoxic single thyroid nodule: Secondary | ICD-10-CM | POA: Diagnosis present

## 2015-06-14 ENCOUNTER — Telehealth: Payer: Self-pay | Admitting: Nurse Practitioner

## 2015-06-14 NOTE — Telephone Encounter (Signed)
Tried to call pt with results. Will send MyChart- busy tone

## 2015-06-16 NOTE — Telephone Encounter (Signed)
Couldn't reach pt. Busy signal. Copywriter, advertisingent MyChart

## 2015-06-17 ENCOUNTER — Other Ambulatory Visit: Payer: Self-pay

## 2015-06-18 ENCOUNTER — Ambulatory Visit (INDEPENDENT_AMBULATORY_CARE_PROVIDER_SITE_OTHER): Payer: 59 | Admitting: Endocrinology

## 2015-06-18 ENCOUNTER — Encounter: Payer: Self-pay | Admitting: Endocrinology

## 2015-06-18 VITALS — BP 128/84 | HR 92 | Temp 98.7°F | Ht 63.75 in | Wt 165.0 lb

## 2015-06-18 DIAGNOSIS — E042 Nontoxic multinodular goiter: Secondary | ICD-10-CM

## 2015-06-18 MED ORDER — METHIMAZOLE 5 MG PO TABS: 5.0000 mg | ORAL_TABLET | ORAL | Status: DC

## 2015-06-18 NOTE — Patient Instructions (Addendum)
Please see a surgery specialist.  you will receive a phone call, about a day and time for an appointment.   i have sent a prescription to your pharmacy, to slow down the thyroid.  if ever you have fever while taking methimazole, stop it and call us, even if the reason is obvious, because of the risk of a rare side-effect.   If you do not undergo surgery, please come back for a follow-up appointment in 1 month.

## 2015-06-18 NOTE — Progress Notes (Signed)
Subjective:    Patient ID: Emily Burns, female    DOB: 09/01/1970, 45 y.o.   MRN: [id number]  HPI In late 2016, pt was incidentally noted on CT to have a thyroid nodule.  She says she was first noted to have a goiter in approx 1996.  bx then was benign in [place].  she has no h/o XRT or surgery to the neck.  She has moderate swelling at the anterior neck, and assoc pain.  She says she is not at risk for another pregnancy.    Past Medical History  Diagnosis Date  . Asthma   . Chicken pox   . Allergy     Seasonal  . Heart murmur   . Hyperlipidemia   . Kidney stone     Past Surgical History  Procedure Laterality Date  . Tonsillectomy and adenoidectomy  1982  . Cesarean section  2011/2012  . Ovarian cyst removal    . Kidney stone  2010/2011    Social History   Social History  . Marital Status: Married    Spouse Name: N/A  . Number of Children: N/A  . Years of Education: N/A   Occupational History  . Not on file.   Social History Main Topics  . Smoking status: Light Tobacco Smoker    Types: Cigarettes    Last Attempt to Quit: 03/11/1995  . Smokeless tobacco: Never Used  . Alcohol Use: 0.0 oz/week    0 Standard drinks or equivalent per week     Comment: Rare   . Drug Use: No  . Sexual Activity:    Partners: Male    Birth Control/ Protection: Coitus interruptus, Condom     Comment: Husband   Other Topics Concern  . 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 atorvastatin (LIPITOR) 40 MG tablet Take 40 mg by mouth daily.    . B Complex-C (B-COMPLEX WITH VITAMIN C) tablet Take 1 tablet by mouth daily.    . Biotin 5000 MCG TABS Take by mouth daily.    .  Calcium-Magnesium (CAL-MAG PO) Take by mouth daily.    . cyclobenzaprine (FLEXERIL) 5 MG tablet Take 1 tablet (5 mg total) by mouth 3 (three) times daily as needed for muscle spasms. 30 tablet 0  . Multiple Vitamin (MULTI-VITAMIN DAILY PO) Take by mouth daily.     No current facility-administered medications on file prior to visit.    No Known Allergies  Family History  Problem Relation Age of Onset  . Hyperlipidemia Mother   . Hypertension Mother   . Heart disease Father   . Hypertension Father   . Hyperlipidemia Brother   . Hyperlipidemia Maternal Aunt   . Hyperlipidemia Maternal Uncle   . Hypertension Maternal Grandmother   . Hyperlipidemia Maternal Grandfather   . Thyroid disease Paternal Aunt     Removed  . Thyroid disease Paternal Grandmother     Removed    BP 128/84 mmHg  Pulse 92  Temp(Src) 98.7 F (37.1 C) (Oral)  Ht 5' 3.75" (1.619 m)  Wt 165 lb (74.844 kg)  BMI 28.55  kg/m2  SpO2 97%  Review of Systems denies hoarseness, visual loss, palpitations, diarrhea, polyuria, muscle weakness, excessive diaphoresis, tremor, anxiety, heat intolerance, and easy bruising.  She has weight gain, dry cough, lightheadedness, headache, nasal congestion, doe, and fatigue.      Objective:   Physical Exam VS: see vs page GEN: no distress HEAD: head: no deformity eyes: no periorbital swelling, no proptosis external nose and ears are normal mouth: no lesion seen NECK: 1 of the thyroid nodules is easily palpable, and moblie.   CHEST WALL: no deformity LUNGS:  Clear to auscultation CV: reg rate and rhythm, no murmur. ABD: abdomen is soft, nontender.  no hepatosplenomegaly.  not distended.  no hernia MUSCULOSKELETAL: muscle bulk and strength are grossly normal.  no obvious joint swelling.  gait is normal and steady EXTEMITIES: no deformity.  Trace bilat leg edema NEURO:  cn 2-12 grossly intact.   readily moves all 4's.  sensation is intact to touch on all 4's. SKIN:  Normal  texture and temperature.  No rash or suspicious lesion is visible.   NODES:  None palpable at the neck PSYCH: alert, well-oriented.  Does not appear anxious nor depressed.   [place]: multinodular goiter.    Lab Results  Component Value Date   TSH 0.28* 05/15/2015   T3TOTAL 115.64 03/11/2015   BX: Beth cat 2  I have reviewed outside records, and summarized: Pt was noted to have goiter and abnormal TSH, and referred here.      Assessment & Plan:  Multinodular goiter, with low-risk cytol result. Hyperthyroidism, due to the goiter, mild Neck pain and swelling, new, uncertain etiology.  Not thyroid-related    Patient is advised the following: Patient Instructions  Please see a surgery specialist.  you will receive a phone call, about a day and time for an appointment.   i have sent a prescription to your pharmacy, to slow down the thyroid.  if ever you have fever while taking methimazole, stop it and call us, even if the reason is obvious, because of the risk of a rare side-effect.   If you do not undergo surgery, please come back for a follow-up appointment in 1 month.

## 2015-06-19 ENCOUNTER — Encounter: Payer: Self-pay | Admitting: Nurse Practitioner

## 2015-06-19 ENCOUNTER — Other Ambulatory Visit: Payer: Self-pay | Admitting: Nurse Practitioner

## 2015-06-19 DIAGNOSIS — E010 Iodine-deficiency related diffuse (endemic) goiter: Secondary | ICD-10-CM

## 2015-06-20 ENCOUNTER — Other Ambulatory Visit: Payer: Self-pay

## 2015-06-20 ENCOUNTER — Telehealth: Payer: Self-pay | Admitting: Endocrinology

## 2015-06-20 DIAGNOSIS — T50905A Adverse effect of unspecified drugs, medicaments and biological substances, initial encounter: Secondary | ICD-10-CM

## 2015-06-20 DIAGNOSIS — T887XXA Unspecified adverse effect of drug or medicament, initial encounter: Secondary | ICD-10-CM | POA: Insufficient documentation

## 2015-06-20 NOTE — Telephone Encounter (Signed)
See note below and please advise, Thanks! 

## 2015-06-20 NOTE — Telephone Encounter (Signed)
Please come in for cbc.  If the symptoms are thyroid-related,it will show up on this blood test

## 2015-06-20 NOTE — Telephone Encounter (Signed)
I contacted the pt and advised of note below. Pt verbalized understanding and is going to have her blood tests completed at [facility] in [place].

## 2015-06-20 NOTE — Telephone Encounter (Signed)
Patient stated that the methimazole has help her with brain fog, and the swelling has gone down some, but it she constantly feel pressure on her throat, please advise

## 2015-06-21 ENCOUNTER — Other Ambulatory Visit: Payer: Self-pay | Admitting: Nurse Practitioner

## 2015-06-21 ENCOUNTER — Other Ambulatory Visit (INDEPENDENT_AMBULATORY_CARE_PROVIDER_SITE_OTHER): Payer: 59

## 2015-06-21 ENCOUNTER — Telehealth: Payer: Self-pay | Admitting: *Deleted

## 2015-06-21 DIAGNOSIS — Z5181 Encounter for therapeutic drug level monitoring: Secondary | ICD-10-CM

## 2015-06-21 LAB — CBC WITH DIFFERENTIAL/PLATELET
BASOS ABS: 0.1 10*3/uL (ref 0.0–0.1)
BASOS PCT: 1 % (ref 0–1)
Eosinophils Absolute: 0.2 10*3/uL (ref 0.0–0.7)
Eosinophils Relative: 2 % (ref 0–5)
HEMATOCRIT: 35.3 % — AB (ref 36.0–46.0)
HEMOGLOBIN: 11.5 g/dL — AB (ref 12.0–15.0)
LYMPHS PCT: 25 % (ref 12–46)
Lymphs Abs: 2.6 10*3/uL (ref 0.7–4.0)
MCH: 28.9 pg (ref 26.0–34.0)
MCHC: 32.6 g/dL (ref 30.0–36.0)
MCV: 88.7 fL (ref 78.0–100.0)
MONO ABS: 0.8 10*3/uL (ref 0.1–1.0)
MPV: 9.1 fL (ref 8.6–12.4)
Monocytes Relative: 8 % (ref 3–12)
NEUTROS ABS: 6.6 10*3/uL (ref 1.7–7.7)
NEUTROS PCT: 64 % (ref 43–77)
Platelets: 362 10*3/uL (ref 150–400)
RBC: 3.98 MIL/uL (ref 3.87–5.11)
RDW: 13.2 % (ref 11.5–15.5)
WBC: 10.3 10*3/uL (ref 4.0–10.5)

## 2015-06-21 NOTE — Telephone Encounter (Signed)
Labs and dx?  

## 2015-06-24 ENCOUNTER — Telehealth: Payer: Self-pay | Admitting: Endocrinology

## 2015-06-24 NOTE — Telephone Encounter (Signed)
See note below and please advise, Thanks! 

## 2015-06-24 NOTE — Telephone Encounter (Signed)
Emily MilletMegan,   This is your patient, please see below.  Thanks

## 2015-06-24 NOTE — Telephone Encounter (Signed)
Patient stated instead of taking her medication methimazole 3 x a week could she take it every other day,so she could have enough pill to last the rest of the week. Please advise

## 2015-06-24 NOTE — Telephone Encounter (Signed)
Patient stated instead of taking her medication methimazole 3 x a week could she take it every other day,so she could have enough pill to last the rest of the week. Please advise  °   °  °   °  ° ° °

## 2015-06-24 NOTE — Telephone Encounter (Signed)
Ok, do you want me to resend rx for qod?

## 2015-06-25 MED ORDER — METHIMAZOLE 5 MG PO TABS: 5.0000 mg | ORAL_TABLET | ORAL | Status: AC

## 2015-06-25 NOTE — Telephone Encounter (Signed)
i did not order it, but it was normal except for mild anemia

## 2015-06-25 NOTE — Addendum Note (Signed)
Addended by: Romero Belling on: 06/25/2015 12:56 PM   Modules accepted: Orders, Medications

## 2015-06-25 NOTE — Telephone Encounter (Signed)
Ok, thanks.

## 2015-06-25 NOTE — Telephone Encounter (Signed)
This was the order based of the telephone noted from 06/20/2015. Pt had her PCP order so she could have it done in [place].

## 2015-06-25 NOTE — Telephone Encounter (Signed)
I contacted the pt and she would life for her rx to be sent for qod. Pt also would like the results of her CBC blood test.  Please advise, Thanks!

## 2015-06-25 NOTE — Telephone Encounter (Signed)
Ok, i have sent a prescription to your pharmacy  

## 2015-06-25 NOTE — Telephone Encounter (Signed)
Patient would like her CBC results. Pt wants to know your thoughts on the blood test and why we were checking the blood test. Please advise.

## 2015-06-26 NOTE — Telephone Encounter (Signed)
Pt notified of results

## 2015-07-05 ENCOUNTER — Encounter: Payer: Self-pay | Admitting: Nurse Practitioner

## 2015-07-08 ENCOUNTER — Other Ambulatory Visit: Payer: Self-pay | Admitting: Nurse Practitioner

## 2015-07-08 DIAGNOSIS — Z1239 Encounter for other screening for malignant neoplasm of breast: Secondary | ICD-10-CM

## 2015-07-19 ENCOUNTER — Ambulatory Visit: Payer: Self-pay | Admitting: Endocrinology

## 2015-07-25 LAB — HM MAMMOGRAPHY

## 2015-07-30 LAB — HM MAMMOGRAPHY

## 2015-08-01 ENCOUNTER — Encounter: Payer: Self-pay | Admitting: Nurse Practitioner

## 2015-08-07 ENCOUNTER — Encounter: Payer: Self-pay | Admitting: Nurse Practitioner

## 2015-08-07 HISTORY — PX: THYROIDECTOMY: SHX17

## 2015-09-29 ENCOUNTER — Encounter: Payer: Self-pay | Admitting: Nurse Practitioner

## 2015-10-16 ENCOUNTER — Other Ambulatory Visit: Payer: Self-pay | Admitting: Nurse Practitioner

## 2015-10-17 NOTE — Telephone Encounter (Signed)
Last OV was 04/24/15, please advise refill? Thanks

## 2015-11-14 ENCOUNTER — Other Ambulatory Visit: Payer: Self-pay | Admitting: Nurse Practitioner

## 2015-12-20 IMAGING — US US SOFT TISSUE HEAD/NECK
1 series · 11 of 11 positions shown · non-contrast
Comparison: None.

CLINICAL DATA: Left supraclavicular tenderness and swelling

EXAM:
ULTRASOUND OF HEAD/NECK SOFT TISSUES
TECHNIQUE: Ultrasound examination of the head and neck soft tissues was
performed in the area of clinical concern.

[Series 1: us soft tissue head/neck · 0.07mm/px · 11 of 11 slices shown]
[im 1/11]
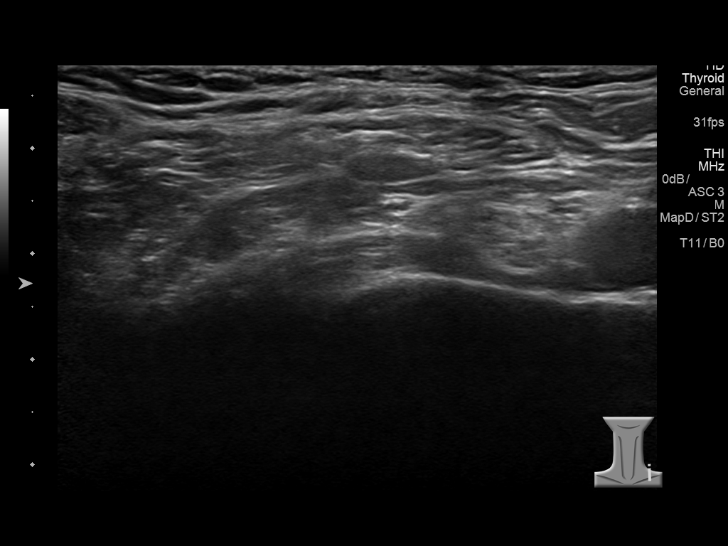
[im 2/11]
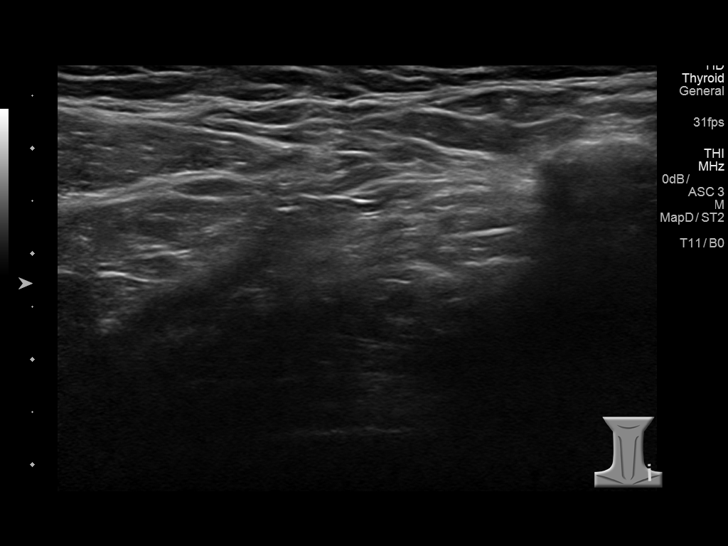
[im 3/11]
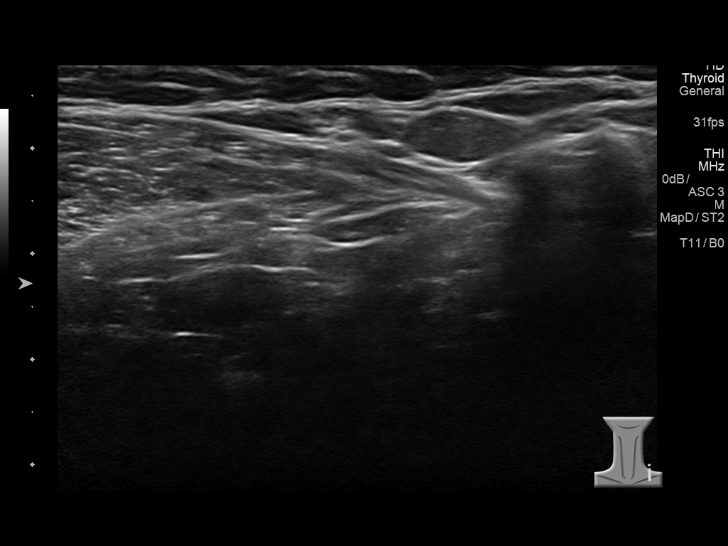
[im 4/11]
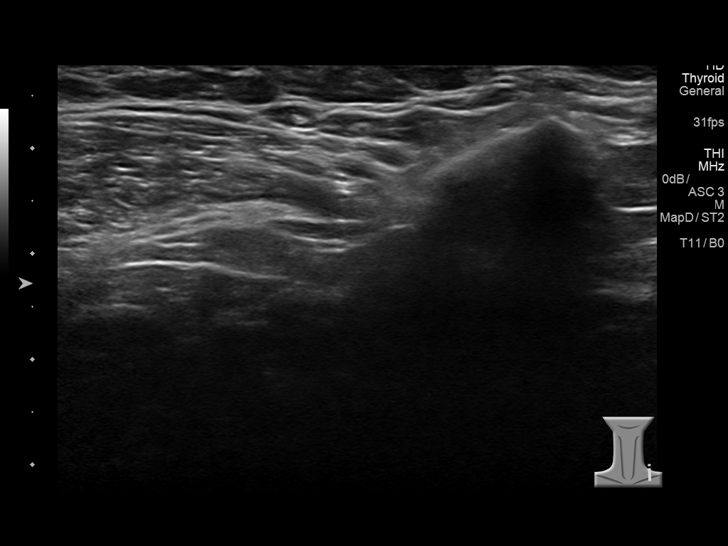
[im 5/11]
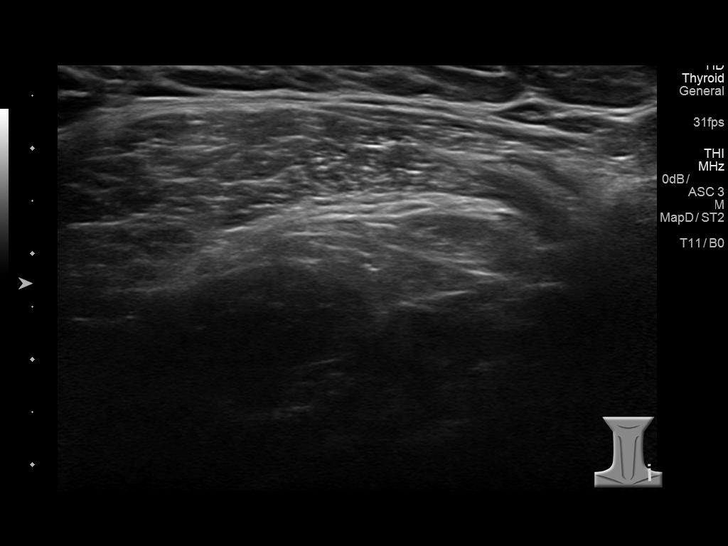
[im 6/11]
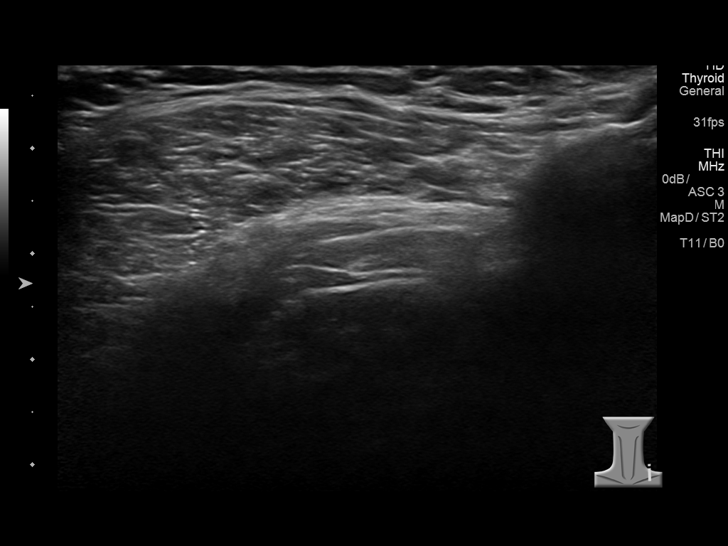
[im 7/11]
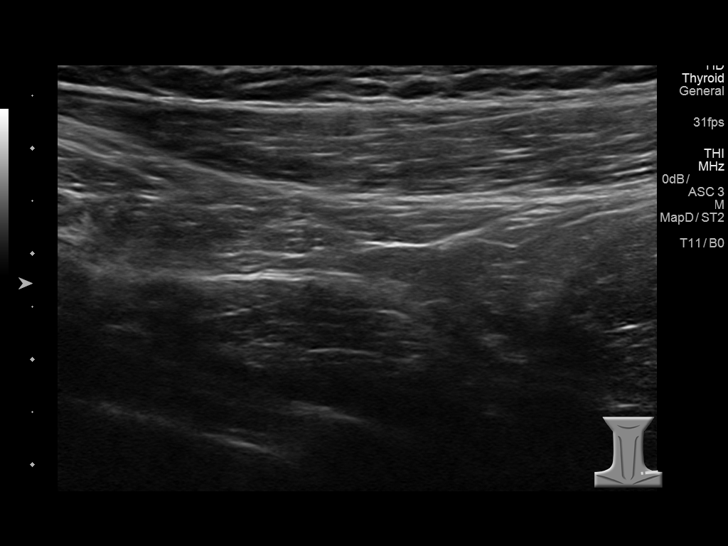
[im 8/11]
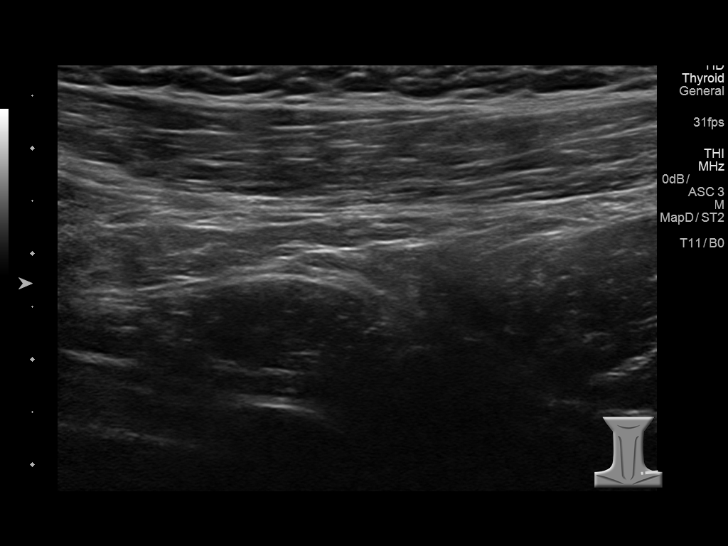
[im 9/11]
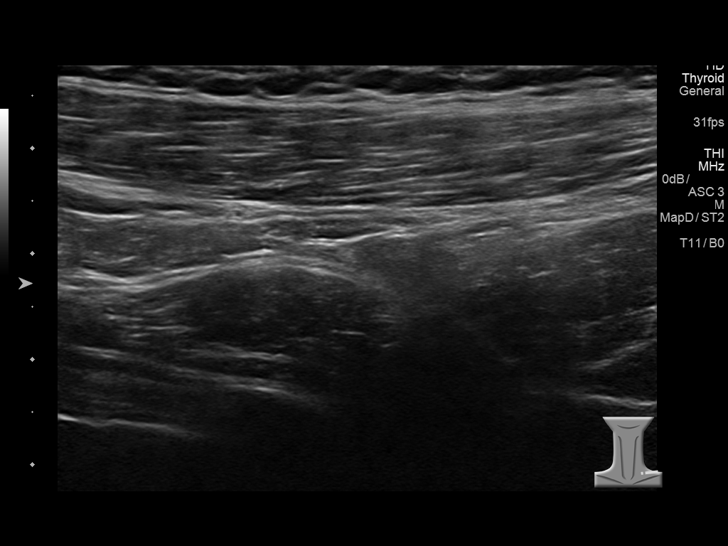
[im 10/11]
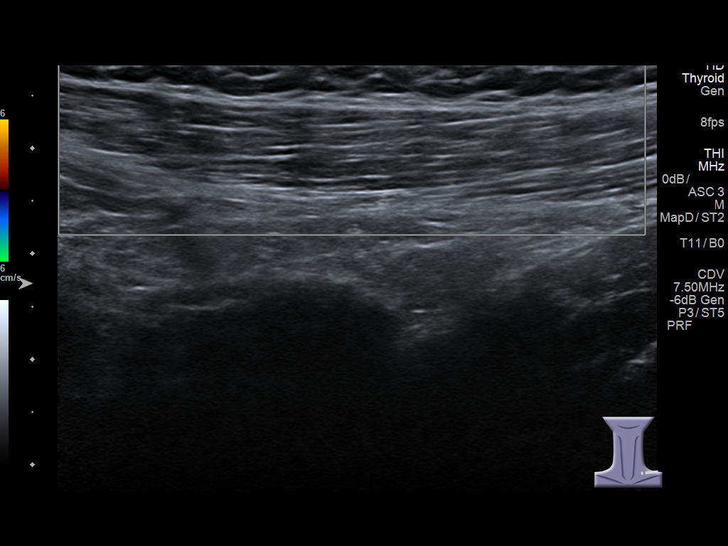
[im 11/11]
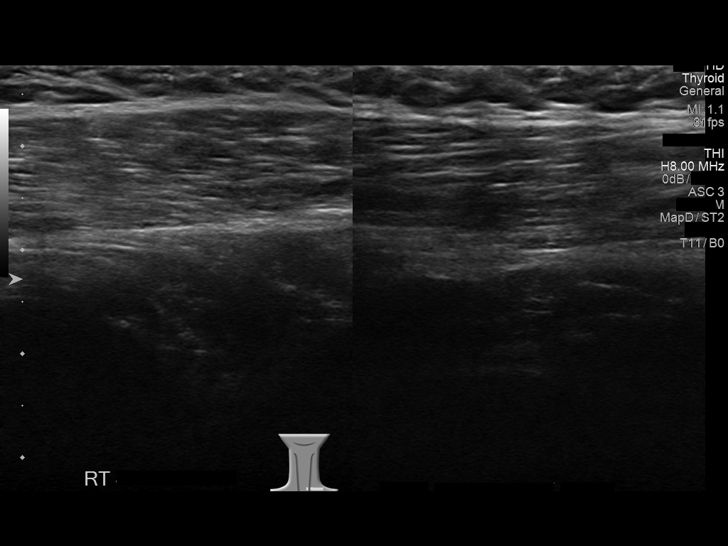

[11 of 11 positions shown; findings below may reference images not displayed]

FINDINGS: No focal mass or lymphadenopathy is identified. Scanning on the
right for comparison shows similar subcutaneous tissue and muscular
bellies.
IMPRESSION: No evidence of lymphadenopathy or focal mass identified.

## 2016-05-22 ENCOUNTER — Other Ambulatory Visit: Payer: Self-pay | Admitting: Nurse Practitioner

## 2016-05-22 DIAGNOSIS — K111 Hypertrophy of salivary gland: Secondary | ICD-10-CM

## 2016-05-26 ENCOUNTER — Ambulatory Visit
Admission: RE | Admit: 2016-05-26 | Discharge: 2016-05-26 | Disposition: A | Discharge status: Home / Self Care | Payer: 59 | Source: Ambulatory Visit | Attending: Nurse Practitioner | Admitting: Nurse Practitioner

## 2016-05-26 DIAGNOSIS — M542 Cervicalgia: Secondary | ICD-10-CM | POA: Insufficient documentation

## 2016-05-26 DIAGNOSIS — K118 Other diseases of salivary glands: Secondary | ICD-10-CM | POA: Diagnosis not present

## 2016-05-26 DIAGNOSIS — K111 Hypertrophy of salivary gland: Secondary | ICD-10-CM

## 2016-05-27 ENCOUNTER — Other Ambulatory Visit: Payer: Self-pay | Admitting: Nurse Practitioner

## 2016-05-27 DIAGNOSIS — M542 Cervicalgia: Secondary | ICD-10-CM

## 2016-08-19 ENCOUNTER — Emergency Department: Payer: 59

## 2016-08-19 ENCOUNTER — Emergency Department
Admission: EM | Admit: 2016-08-19 | Discharge: 2016-08-19 | Disposition: A | Discharge status: Home / Self Care | Payer: 59 | Attending: Emergency Medicine | Admitting: Emergency Medicine

## 2016-08-19 ENCOUNTER — Encounter: Payer: Self-pay | Admitting: Emergency Medicine

## 2016-08-19 DIAGNOSIS — J45909 Unspecified asthma, uncomplicated: Secondary | ICD-10-CM | POA: Insufficient documentation

## 2016-08-19 DIAGNOSIS — N200 Calculus of kidney: Secondary | ICD-10-CM

## 2016-08-19 DIAGNOSIS — Z79899 Other long term (current) drug therapy: Secondary | ICD-10-CM | POA: Diagnosis not present

## 2016-08-19 DIAGNOSIS — F17210 Nicotine dependence, cigarettes, uncomplicated: Secondary | ICD-10-CM | POA: Insufficient documentation

## 2016-08-19 DIAGNOSIS — R1031 Right lower quadrant pain: Secondary | ICD-10-CM | POA: Diagnosis present

## 2016-08-19 LAB — URINALYSIS, COMPLETE (UACMP) WITH MICROSCOPIC
BILIRUBIN URINE: NEGATIVE
Glucose, UA: NEGATIVE mg/dL
KETONES UR: NEGATIVE mg/dL
LEUKOCYTES UA: NEGATIVE
NITRITE: NEGATIVE
PH: 7 (ref 5.0–8.0)
Protein, ur: NEGATIVE mg/dL
SPECIFIC GRAVITY, URINE: 1.016 (ref 1.005–1.030)

## 2016-08-19 LAB — CBC
HCT: 37.7 % (ref 35.0–47.0)
Hemoglobin: 12.9 g/dL (ref 12.0–16.0)
MCH: 29.9 pg (ref 26.0–34.0)
MCHC: 34.3 g/dL (ref 32.0–36.0)
MCV: 87.2 fL (ref 80.0–100.0)
PLATELETS: 314 10*3/uL (ref 150–440)
RBC: 4.32 MIL/uL (ref 3.80–5.20)
RDW: 13.7 % (ref 11.5–14.5)
WBC: 9.4 10*3/uL (ref 3.6–11.0)

## 2016-08-19 LAB — BASIC METABOLIC PANEL
ANION GAP: 5 (ref 5–15)
BUN: 23 mg/dL — ABNORMAL HIGH (ref 6–20)
CALCIUM: 8.7 mg/dL — AB (ref 8.9–10.3)
CO2: 28 mmol/L (ref 22–32)
Chloride: 107 mmol/L (ref 101–111)
Creatinine, Ser: 0.54 mg/dL (ref 0.44–1.00)
GFR calc non Af Amer: >60 mL/min (ref >60)
Glucose, Bld: 109 mg/dL — ABNORMAL HIGH (ref 65–99)
POTASSIUM: 3.6 mmol/L (ref 3.5–5.1)
SODIUM: 140 mmol/L (ref 135–145)

## 2016-08-19 LAB — POCT PREGNANCY, URINE: PREG TEST UR: NEGATIVE

## 2016-08-19 MED ORDER — OXYCODONE-ACETAMINOPHEN 5-325 MG PO TABS: 1.0000 | ORAL_TABLET | Freq: Four times a day (QID) | ORAL | 0 refills | Status: AC | PRN

## 2016-08-19 MED ORDER — IBUPROFEN 800 MG PO TABS
800.0000 mg | ORAL_TABLET | Freq: Once | ORAL | Status: AC
  Administered 2016-08-19: 800 mg via ORAL
  Filled 2016-08-19: qty 1

## 2016-08-19 MED ORDER — ONDANSETRON 4 MG PO TBDP: 4.0000 mg | ORAL_TABLET | Freq: Three times a day (TID) | ORAL | 0 refills | Status: AC | PRN

## 2016-08-19 MED ORDER — SODIUM CHLORIDE 0.9 % IV BOLUS (SEPSIS): 1000.0000 mL | Freq: Once | INTRAVENOUS | Status: DC

## 2016-08-19 MED ORDER — TAMSULOSIN HCL 0.4 MG PO CAPS: 0.4000 mg | ORAL_CAPSULE | Freq: Every day | ORAL | 0 refills | Status: AC

## 2016-08-19 MED ORDER — KETOROLAC TROMETHAMINE 30 MG/ML IJ SOLN: 30.0000 mg | Freq: Once | INTRAMUSCULAR | Status: DC

## 2016-08-19 NOTE — ED Provider Notes (Signed)
[facility] Emergency Department Provider Note   ____________________________________________   First MD Initiated Contact with Patient 08/19/16 [phone]     (approximate)  I have reviewed the triage vital signs and the nursing notes.   HISTORY  Chief Complaint Flank Pain    HPI Emily Burns is a 46 y.o. female who comes into the hospital today thinking she has a kidney stone or bladder infection. The patient has had suprapubic pain that wraps around to the right side and into her back. She reports she is having some urgency and pain with urination and feels that she is having bladder spasms. The symptoms started about a week and a half ago. Thought it was cramping due to her period but then her period stopped 2 days ago and she was still having pain. The patient reports that she woke up this morning at 1 AM with some strong pain. She said that it felt familiar which is why she assumes it's a stone. The patient hasn't noticed any blood in her urine and reports that her pain is 8 out of 10 in intensity.   Past Medical History:  Diagnosis Date  . Allergy    Seasonal  . Asthma   . Chicken pox   . Heart murmur   . Hyperlipidemia   . Kidney stone     Patient Active Problem List   Diagnosis Date Noted  . Drug side effects 06/20/2015  . Multinodular goiter 06/18/2015  . 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 Abdominal pain, epigastric 04/25/2015  . Nonallopathic lesion-rib cage 04/25/2015  . Muscle spasm 04/15/2015  . Myalgia and myositis 04/15/2015  . Thyroid disease 03/11/2015  . Hyperlipidemia 03/11/2015  . Encounter to establish care 03/11/2015  . Measles, mumps, rubella (MMR) vaccination up to date 03/11/2015  . Asthma, exercise induced 03/11/2015  . Heart murmur 03/11/2015    Past Surgical History:  Procedure Laterality Date  . CESAREAN SECTION  2011/2012  . kidney  stone  2010/2011  . OVARIAN CYST REMOVAL    . THYROIDECTOMY  08/2015   [facility]  . TONSILLECTOMY AND ADENOIDECTOMY  1982    Prior to Admission medications   Medication Sig Start Date End Date Taking? Authorizing Provider  atorvastatin (LIPITOR) 40 MG tablet Take 40 mg by mouth daily.    Historical Provider, MD  B Complex-C (B-COMPLEX WITH VITAMIN C) tablet Take 1 tablet by mouth daily.    Historical Provider, MD  Biotin 5000 MCG TABS Take by mouth daily.    Historical Provider, MD  Calcium-Magnesium (CAL-MAG PO) Take by mouth daily.    Historical Provider, MD  cholecalciferol (VITAMIN D) 1000 units tablet Take 1,000 Units by mouth daily.    Historical Provider, MD  cyclobenzaprine (FLEXERIL) 5 MG tablet Take 1 tablet (5 mg total) by mouth 3 (three) times daily as needed for muscle spasms. 04/11/15   Rubbie Battiest, NP  meloxicam (MOBIC) 15 MG tablet TAKE 1 TABLET(15 MG) BY MOUTH DAILY 10/17/15   Rubbie Battiest, NP  methimazole (TAPAZOLE) 5 MG tablet Take 1 tablet (5 mg total) by mouth every other day. 06/25/15   Renato Shin, MD  Multiple Vitamin (MULTI-VITAMIN DAILY PO) Take by mouth daily.    Historical Provider, MD  ondansetron (ZOFRAN ODT) 4 MG disintegrating tablet Take 1 tablet (4 mg total) by mouth every 8 (eight) hours as needed for nausea or vomiting. 08/19/16   Eduard Roux  Dahlia Client, MD  oxyCODONE-acetaminophen (ROXICET) 5-325 MG tablet Take 1 tablet by mouth every 6 (six) hours as needed. 08/19/16   Loney Hering, MD  tamsulosin (FLOMAX) 0.4 MG CAPS capsule Take 1 capsule (0.4 mg total) by mouth daily. 08/19/16   Loney Hering, MD  Turmeric 500 MG CAPS Take by mouth 2 (two) times daily.    Historical Provider, MD    Allergies Patient has no known allergies.  Family History  Problem Relation Age of Onset  . Hyperlipidemia Mother   . Hypertension Mother   . Heart disease Father   . Hypertension Father   . Hyperlipidemia Brother   . Hyperlipidemia Maternal Aunt   . Hyperlipidemia  Maternal Uncle   . Hypertension Maternal Grandmother   . Hyperlipidemia Maternal Grandfather   . Thyroid disease Paternal Aunt     Removed  . Thyroid disease Paternal Grandmother     Removed    Social History Social History  Substance Use Topics  . Smoking status: Light Tobacco Smoker    Types: Cigarettes    Last attempt to quit: 03/11/1995  . Smokeless tobacco: Never Used  . Alcohol use 0.0 oz/week     Comment: Rare     Review of Systems Constitutional: No fever/chills Eyes: No visual changes. ENT: No sore throat. Cardiovascular: Denies chest pain. Respiratory: Denies shortness of breath. Gastrointestinal:  abdominal pain,  Nausea, vomiting.  No diarrhea.  No constipation. Genitourinary: dysuria And urgency Musculoskeletal:  back pain. Skin: Negative for rash. Neurological: Negative for headaches, focal weakness or numbness.  10-point ROS otherwise negative.  ____________________________________________   PHYSICAL EXAM:  VITAL SIGNS: ED Triage Vitals  Enc Vitals Group     BP 08/19/16 0243 (!) 160/102     Pulse Rate 08/19/16 0243 85     Resp 08/19/16 0243 (!) 22     Temp 08/19/16 0243 98.4 F (36.9 C)     Temp Source 08/19/16 0243 Oral     SpO2 08/19/16 0243 100 %     Weight 08/19/16 0244 160 lb (72.6 kg)     Height 08/19/16 0244 _0  (1.626 m)     Head Circumference --      Peak Flow --      Pain Score 08/19/16 0244 9     Pain Loc --      Pain Edu? --      Excl. in [place]? --     Constitutional: Alert and oriented. Well appearing and in mild distress. Eyes: Conjunctivae are normal. PERRL. EOMI. Head: Atraumatic. Nose: No congestion/rhinnorhea. Mouth/Throat: Mucous membranes are moist.  Oropharynx non-erythematous. Cardiovascular: Normal rate, regular rhythm. Grossly normal heart sounds.  Good peripheral circulation. Respiratory: Normal respiratory effort.  No retractions. Lungs CTAB. Gastrointestinal: Soft and nontender. No distention. Positive bowel  sounds, Right sided CVA tenderness to palpation Musculoskeletal: No lower extremity tenderness nor edema.   Neurologic:  Normal speech and language.  Skin:  Skin is warm, dry and intact.  Psychiatric: Mood and affect are normal.   ____________________________________________   LABS (all labs ordered are listed, but only abnormal results are displayed)  Labs Reviewed  BASIC METABOLIC PANEL - Abnormal; Notable for the following:       Result Value   Glucose, Bld 109 (*)    BUN 23 (*)    Calcium 8.7 (*)    All other components within normal limits  URINALYSIS, COMPLETE (UACMP) WITH MICROSCOPIC - Abnormal; Notable for the following:    Color, Urine YELLOW (*)  APPearance CLEAR (*)    Hgb urine dipstick MODERATE (*)    Bacteria, UA RARE (*)    Squamous Epithelial / LPF 0-5 (*)    All other components within normal limits  CBC  POC URINE PREG, ED  POCT PREGNANCY, URINE   ____________________________________________  EKG  none ____________________________________________  RADIOLOGY  CT renal stone study ____________________________________________   PROCEDURES  Procedure(s) performed: None  Procedures  Critical Care performed: No  ____________________________________________   INITIAL IMPRESSION / ASSESSMENT AND PLAN / ED COURSE  Pertinent labs & imaging results that were available during my care of the patient were reviewed by me and considered in my medical decision making (see chart for details).  This is a 46 year old female who comes into the hospital today with some right-sided flank pain and suprapubic pain. The patient did have a CT scan appears to have a kidney stone at the right UVJ. The stone is only 3.7 mm. I did initially order some Toradol and fluids for the patient but she did not want an IV. She was asking if we could give her something oral. I did then give the patient some ibuprofen as she is driving and I will give her prescription for some  Percocet. I explained to the patient that she should follow-up with urology if she continues to have any pain. Otherwise the patient be discharged home to follow-up. The patient has no further complaints or concerns at this time.  Clinical Course as of Aug 19 649  Wed Aug 19, 2016  0509 Stone in the right ureterovesical junction with moderate proximal obstruction.   CT Renal Laren Everts [AW]    Clinical Course User Index [AW] Loney Hering, MD     ____________________________________________   FINAL CLINICAL IMPRESSION(S) / ED DIAGNOSES  Final diagnoses:  Kidney stone      NEW MEDICATIONS STARTED DURING THIS VISIT:  New Prescriptions   ONDANSETRON (ZOFRAN ODT) 4 MG DISINTEGRATING TABLET    Take 1 tablet (4 mg total) by mouth every 8 (eight) hours as needed for nausea or vomiting.   OXYCODONE-ACETAMINOPHEN (ROXICET) 5-325 MG TABLET    Take 1 tablet by mouth every 6 (six) hours as needed.   TAMSULOSIN (FLOMAX) 0.4 MG CAPS CAPSULE    Take 1 capsule (0.4 mg total) by mouth daily.     Note:  This document was prepared using Dragon voice recognition software and may include unintentional dictation errors.    Loney Hering, MD 08/19/16 [phone]

## 2016-08-19 NOTE — ED Notes (Signed)
Dr. Webster at the bedside for pt evaluation 

## 2016-08-19 NOTE — ED Triage Notes (Signed)
Pt in with co right flank pain x 1 week that has worsened tonight. States has urinary frequency and urgency.

## 2017-01-09 IMAGING — CR DG RIBS W/ CHEST 3+V*L*
1 series · 4 of 4 positions shown · non-contrast
Comparison: None.

CLINICAL DATA: Lower left rib pain for 1-1/2 weeks, initial
encounter

EXAM:
LEFT RIBS AND CHEST - 3+ VIEW

[Series 1: w chest pa · 0.14mm/px · 4 of 4 slices shown]
[im 1/4]
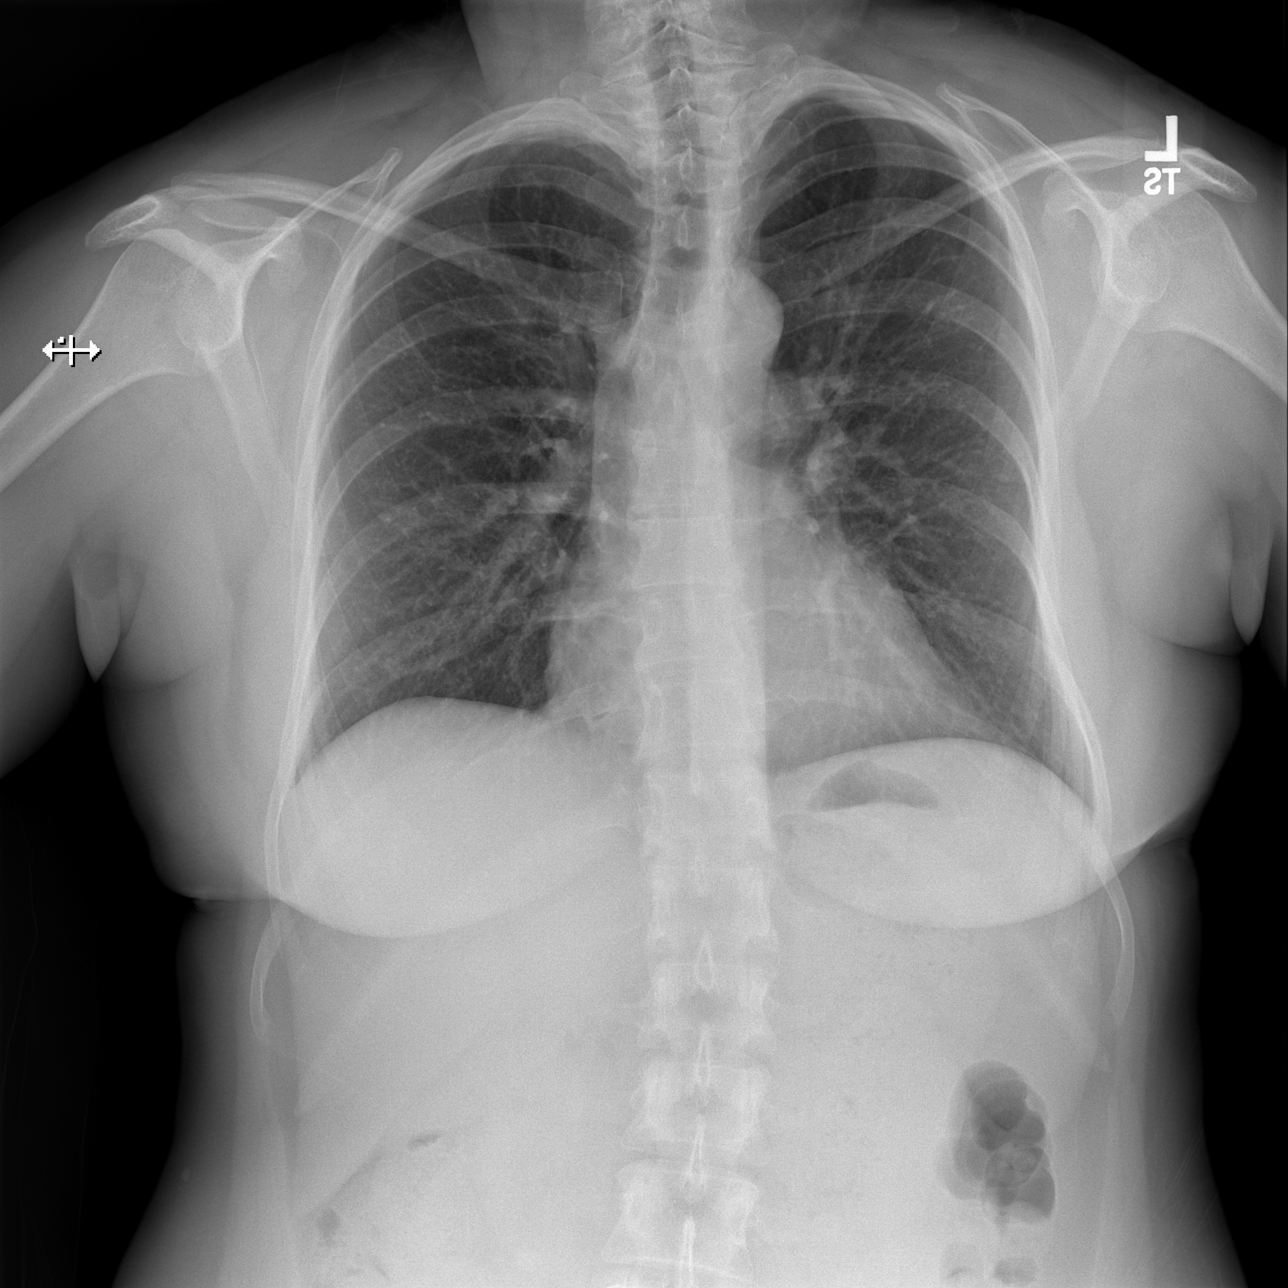
[im 2/4]
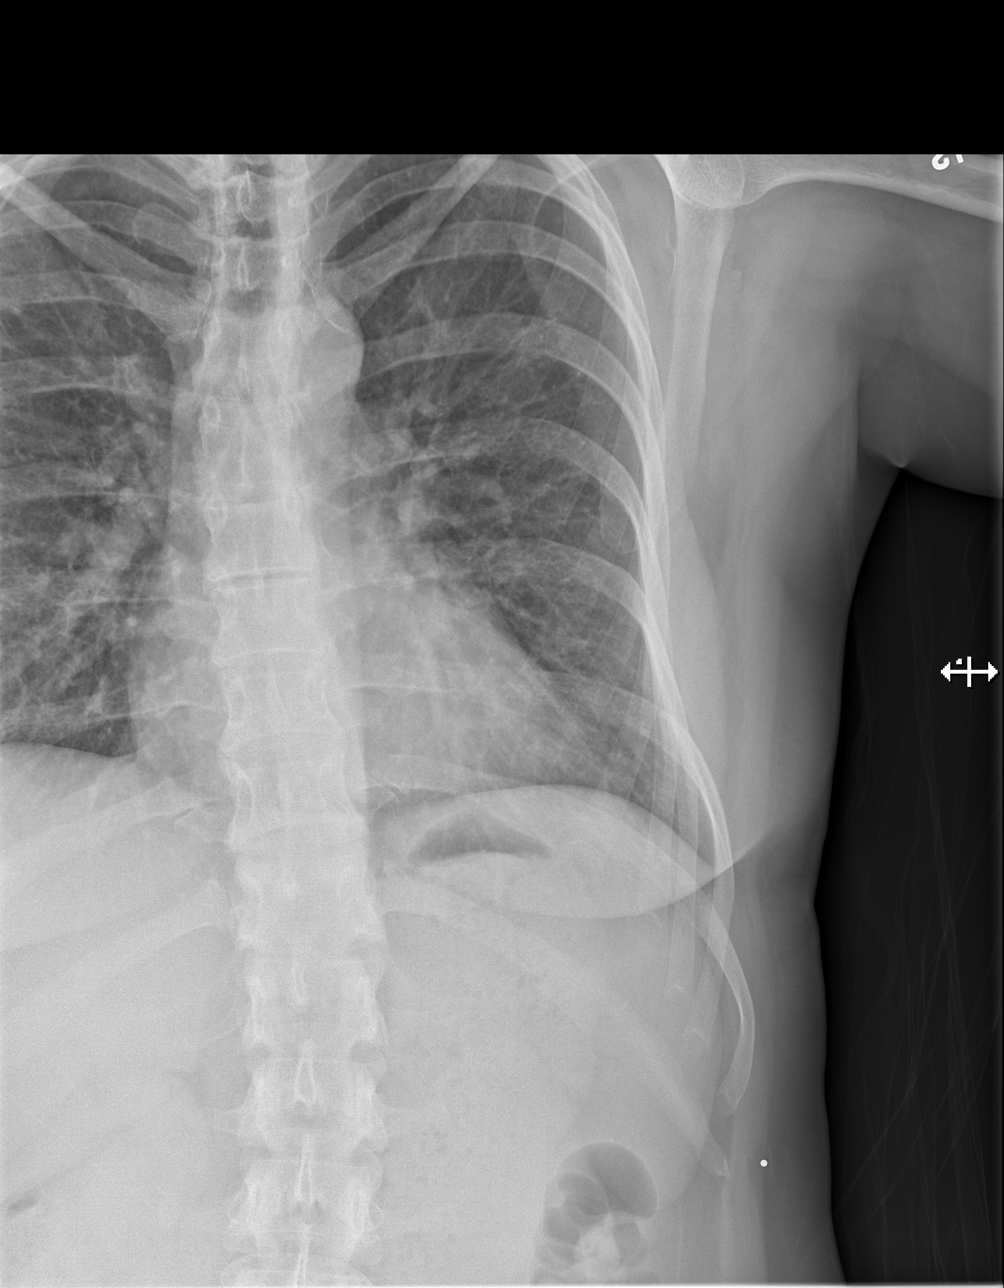
[im 3/4]
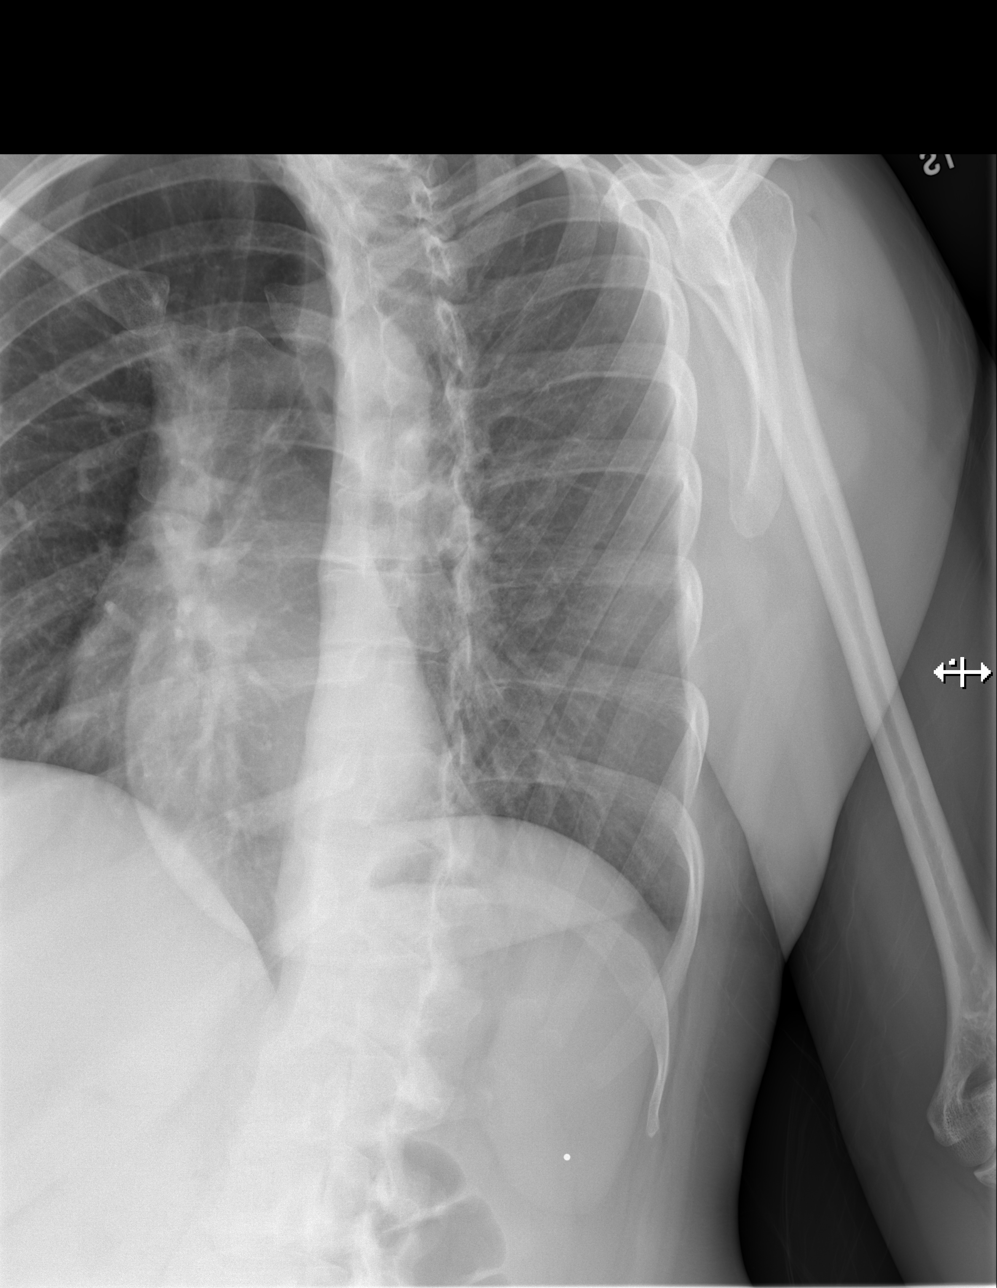
[im 4/4]
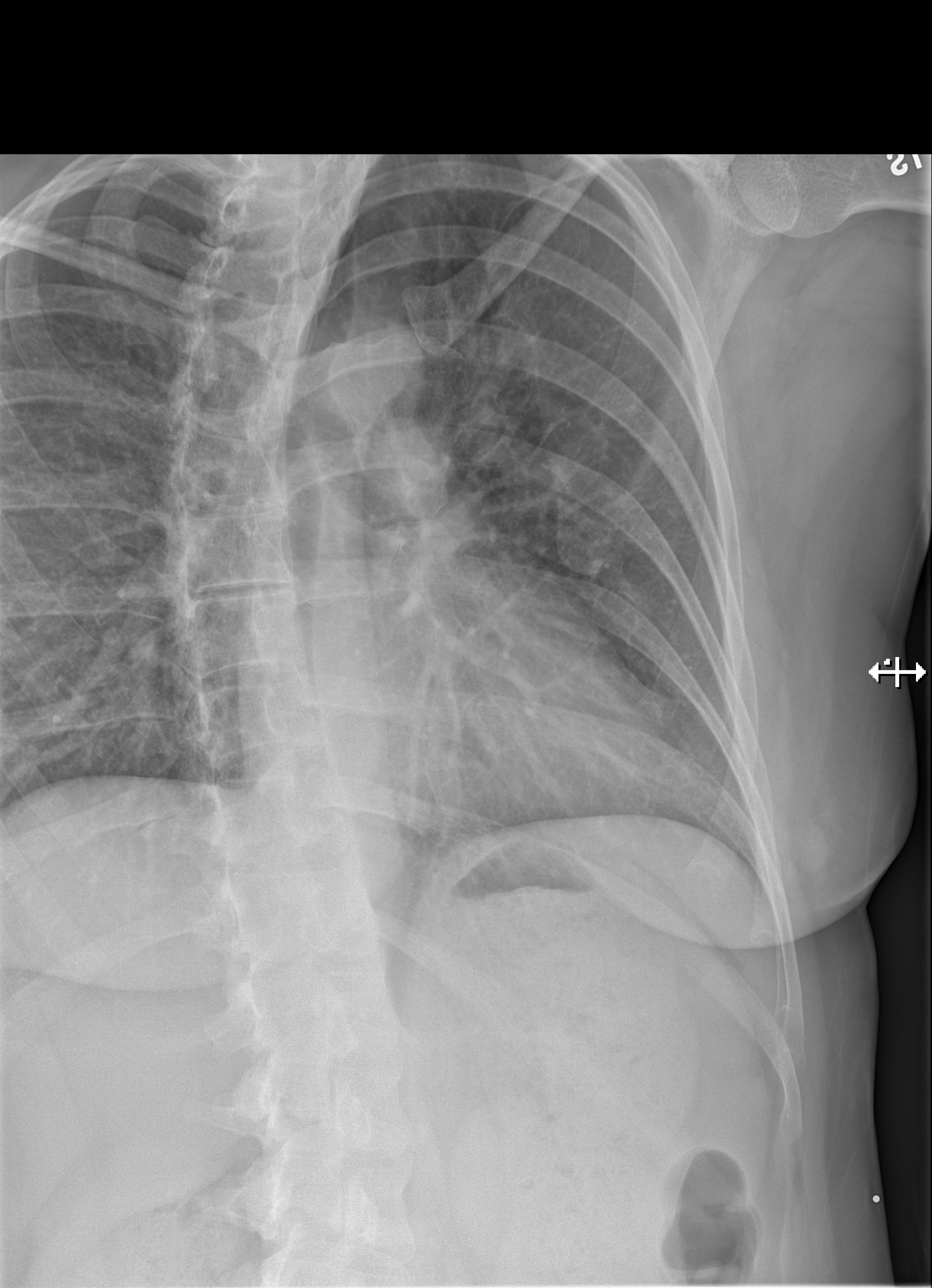

[4 of 4 positions shown; findings below may reference images not displayed]

FINDINGS: Cardiac shadow is within normal limits. The lungs are clear
bilaterally. No acute rib fracture is noted. No other focal bony
abnormality is noted.
IMPRESSION: No acute abnormality seen.

## 2017-01-09 IMAGING — CT CT CHEST W/ CM
1 series · 16 of 31 positions shown, 20 images · IV contrast (omnipaque)
Comparison: Chest radiograph dated 05/13/2015

CLINICAL DATA: Pain under left lateral ribcage for 1 month.

EXAM:
CT CHEST WITH CONTRAST
TECHNIQUE: Multidetector CT imaging of the chest was performed during
intravenous contrast administration.
CONTRAST:  75mL OMNIPAQUE IOHEXOL 300 MG/ML  SOLN

[Series 2: routine chest with · axial · 0.61mm/px · z∈[-283,-28]mm · 16 of 57 slices shown, 20 images]
[im 3/57  mediastinal]
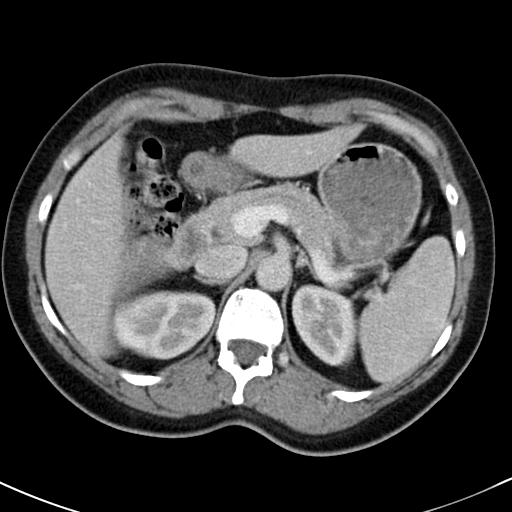
[im 3/57  lung]
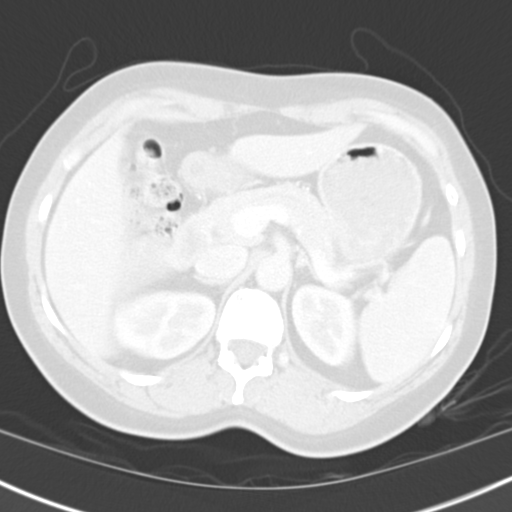
[im 7/57  lung]
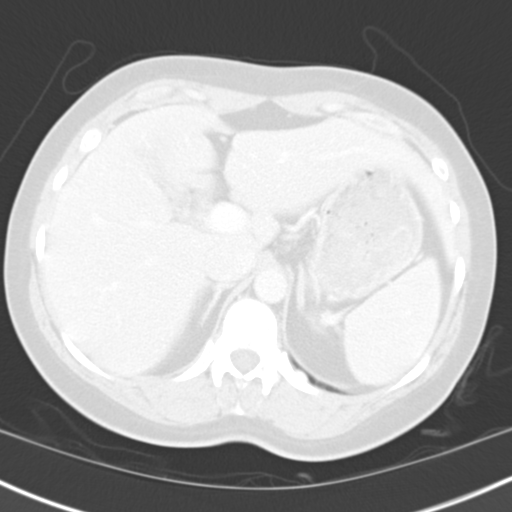
[im 11/57  lung]
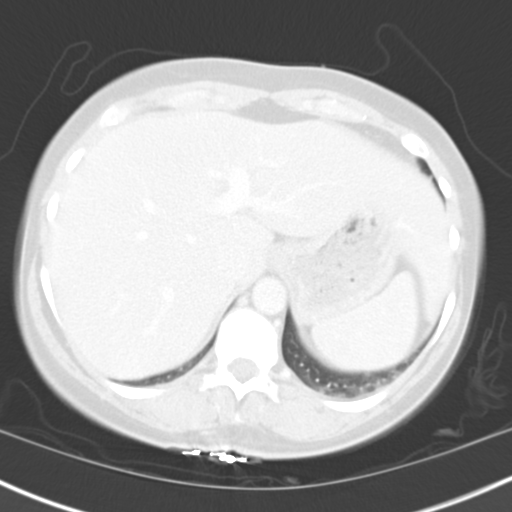
[im 13/57  lung]
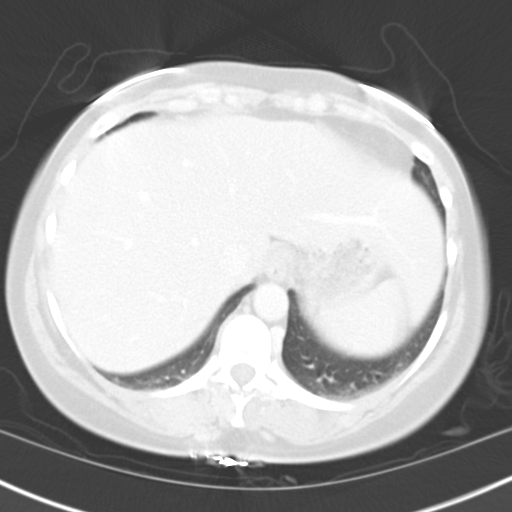
[im 17/57  mediastinal]
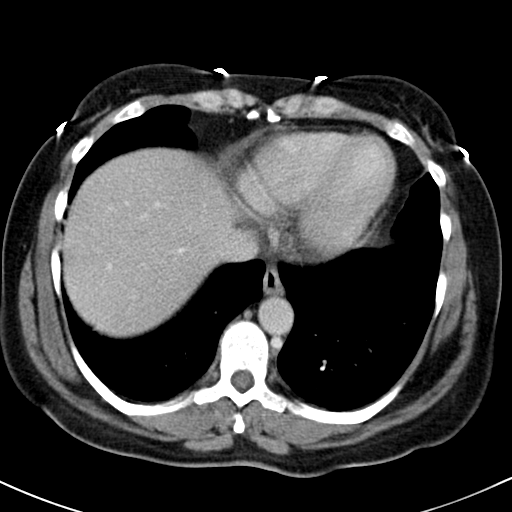
[im 17/57  lung]
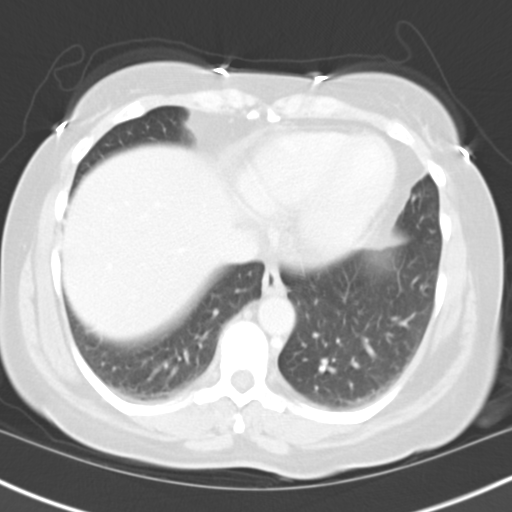
[im 19/57  lung]
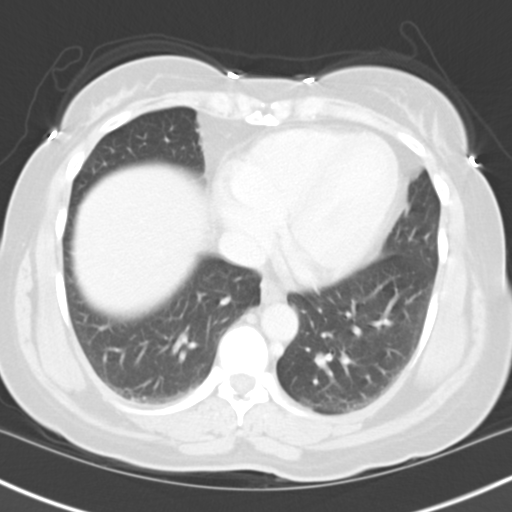
[im 23/57  lung]
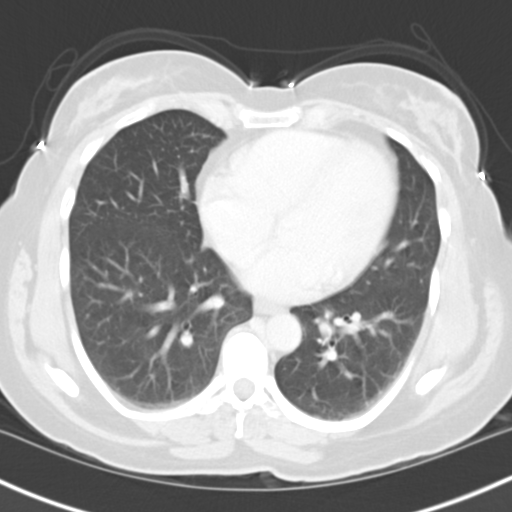
[im 27/57  lung]
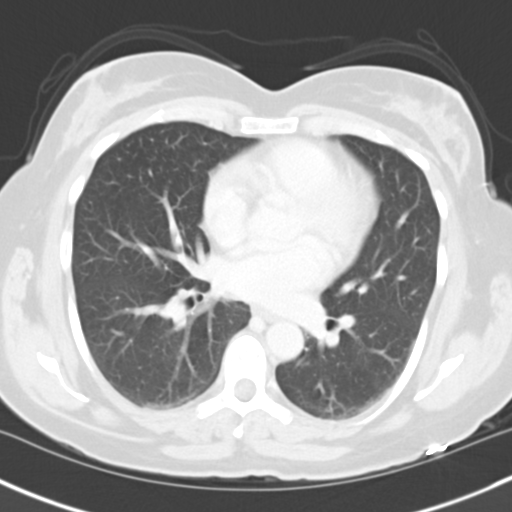
[im 31/57  mediastinal]
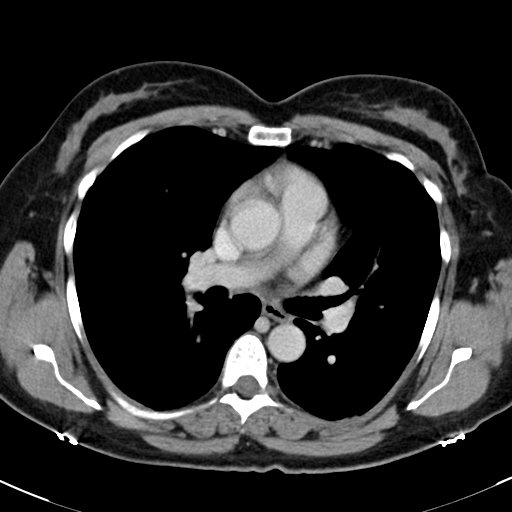
[im 31/57  lung]
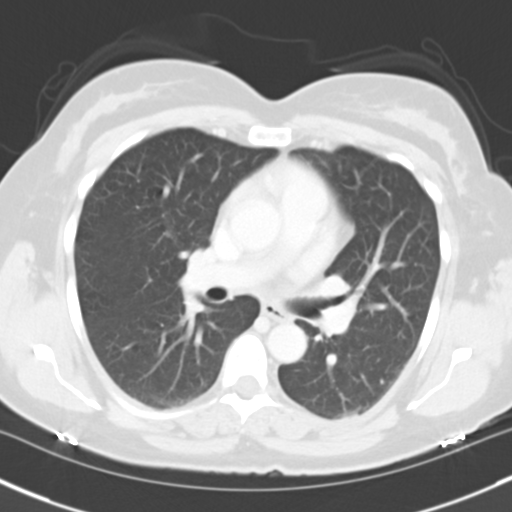
[im 34/57  lung]
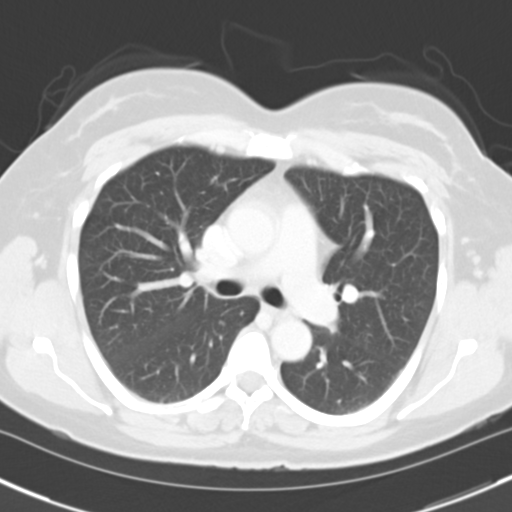
[im 38/57  lung]
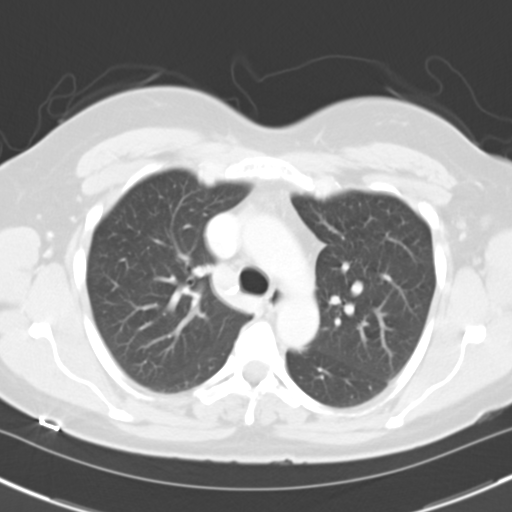
[im 40/57  lung]
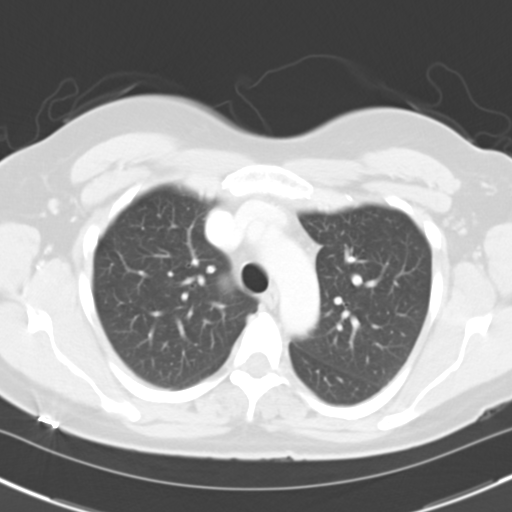
[im 44/57  mediastinal]
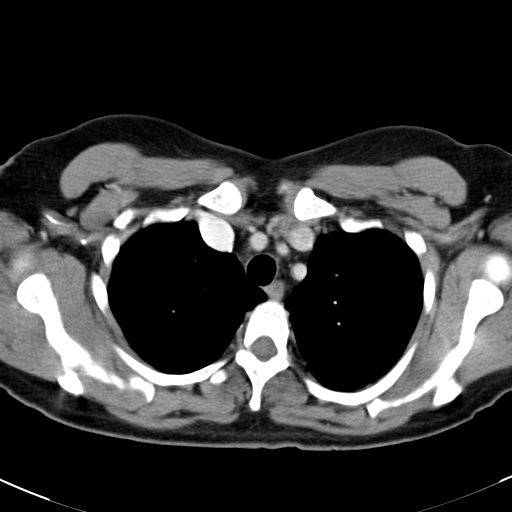
[im 44/57  lung]
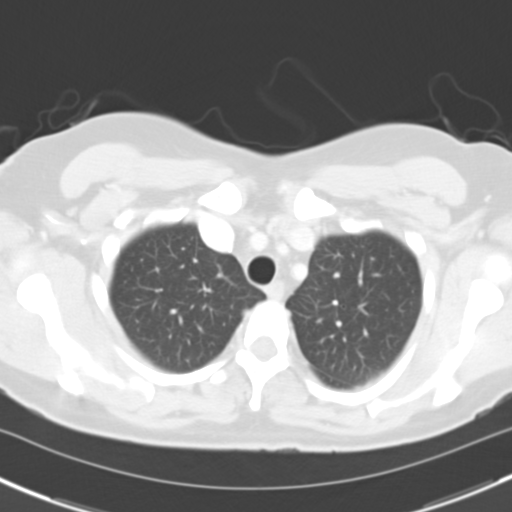
[im 46/57  lung]
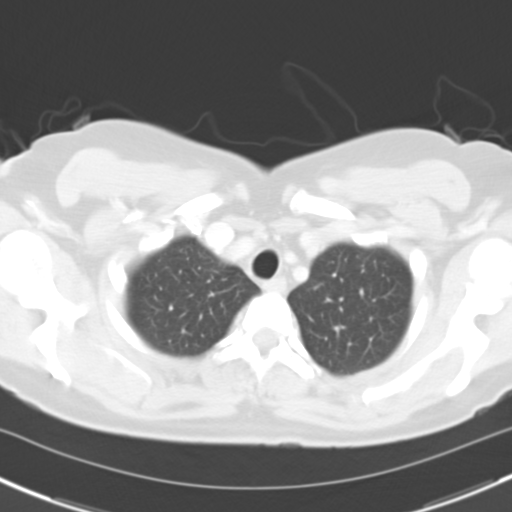
[im 50/57  lung]
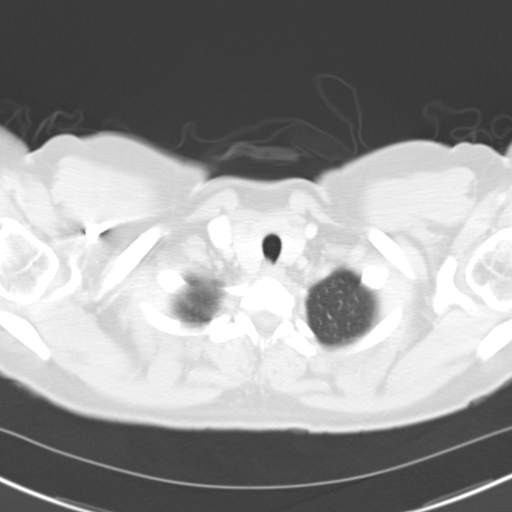
[im 54/57  lung]
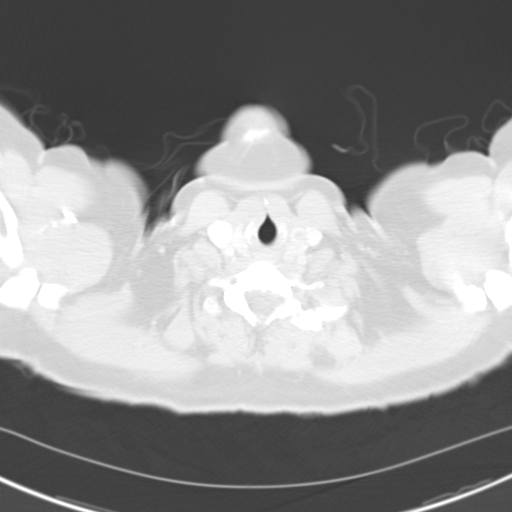

[16 of 31 positions shown; findings below may reference images not displayed]

FINDINGS: Mediastinum/Lymph Nodes: There is a 7 mm right thyroid lobe nodule.
No masses, pathologically enlarged lymph nodes, or other significant
abnormality.

Lungs/Pleura: No pulmonary mass, infiltrate, or effusion.

Upper abdomen: No acute findings.

Musculoskeletal: No chest wall mass or suspicious bone lesions
identified.
IMPRESSION: No acute findings within the thorax to explain patient's pain.

Incidental note of 7 mm right thyroid lobe nodule. Further
evaluation with thyroid ultrasound may be considered if found
clinically necessary.
